# Patient Record
Sex: Female | Born: 1980 | Race: White | Hispanic: No | Marital: Single | Smoking: Current every day smoker
Health system: Southern US, Community
[De-identification: ages and names within clinical notes are randomized; demographics above are authoritative.]

## PROBLEM LIST (undated history)

## (undated) DIAGNOSIS — F319 Bipolar disorder, unspecified: Secondary | ICD-10-CM

## (undated) DIAGNOSIS — F209 Schizophrenia, unspecified: Secondary | ICD-10-CM

## (undated) HISTORY — PX: NO PAST SURGERIES: SHX2092

---
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
## 2004-07-18 ENCOUNTER — Emergency Department: Payer: Self-pay | Admitting: Emergency Medicine

## 2004-08-05 ENCOUNTER — Emergency Department: Payer: Self-pay | Admitting: Emergency Medicine

## 2005-09-20 ENCOUNTER — Emergency Department: Payer: Self-pay | Admitting: Emergency Medicine

## 2006-04-15 ENCOUNTER — Observation Stay: Payer: Self-pay | Admitting: Obstetrics and Gynecology

## 2006-04-16 ENCOUNTER — Observation Stay: Payer: Self-pay

## 2006-04-18 ENCOUNTER — Observation Stay: Payer: Self-pay

## 2006-04-21 ENCOUNTER — Inpatient Hospital Stay: Payer: Self-pay | Admitting: Obstetrics and Gynecology

## 2006-06-19 ENCOUNTER — Emergency Department: Payer: Self-pay | Admitting: Internal Medicine

## 2006-07-21 ENCOUNTER — Emergency Department: Payer: Self-pay

## 2008-01-29 ENCOUNTER — Emergency Department: Payer: Self-pay | Admitting: Internal Medicine

## 2008-03-07 ENCOUNTER — Emergency Department: Payer: Self-pay | Admitting: Emergency Medicine

## 2008-04-15 ENCOUNTER — Emergency Department: Payer: Self-pay | Admitting: Internal Medicine

## 2008-10-28 ENCOUNTER — Emergency Department: Payer: Self-pay | Admitting: Emergency Medicine

## 2009-05-23 ENCOUNTER — Encounter: Payer: Self-pay | Admitting: Obstetrics and Gynecology

## 2009-05-24 ENCOUNTER — Encounter: Payer: Self-pay | Admitting: Obstetrics and Gynecology

## 2009-06-05 ENCOUNTER — Observation Stay: Payer: Self-pay | Admitting: Obstetrics and Gynecology

## 2009-07-04 ENCOUNTER — Emergency Department: Payer: Self-pay | Admitting: Internal Medicine

## 2009-07-30 ENCOUNTER — Observation Stay: Payer: Self-pay

## 2009-08-01 ENCOUNTER — Observation Stay: Payer: Self-pay | Admitting: Obstetrics and Gynecology

## 2009-08-09 ENCOUNTER — Inpatient Hospital Stay: Payer: Self-pay | Admitting: Obstetrics and Gynecology

## 2011-12-02 ENCOUNTER — Emergency Department: Payer: Self-pay | Admitting: Emergency Medicine

## 2011-12-02 LAB — URINALYSIS, COMPLETE
Bacteria: NONE SEEN
Bilirubin,UR: NEGATIVE
Blood: NEGATIVE
Glucose,UR: NEGATIVE mg/dL (ref 0–75)
Ketone: NEGATIVE
Leukocyte Esterase: NEGATIVE
Nitrite: NEGATIVE
Ph: 6 (ref 4.5–8.0)
Protein: NEGATIVE
RBC,UR: <1 /HPF (ref 0–5)
Specific Gravity: 1.013 (ref 1.003–1.030)
Squamous Epithelial: 3
WBC UR: 1 /HPF (ref 0–5)

## 2011-12-02 LAB — PREGNANCY, URINE: Pregnancy Test, Urine: NEGATIVE m[IU]/mL

## 2012-04-22 ENCOUNTER — Ambulatory Visit: Payer: Self-pay | Admitting: Advanced Practice Midwife

## 2012-06-20 ENCOUNTER — Ambulatory Visit: Payer: Self-pay | Admitting: Family Medicine

## 2012-09-04 ENCOUNTER — Observation Stay: Payer: Self-pay

## 2012-09-04 LAB — URINALYSIS, COMPLETE
Bilirubin,UR: NEGATIVE
Blood: NEGATIVE
Glucose,UR: NEGATIVE mg/dL (ref 0–75)
Nitrite: NEGATIVE
Ph: 6 (ref 4.5–8.0)
Protein: NEGATIVE
RBC,UR: 1 /HPF (ref 0–5)
Specific Gravity: 1.015 (ref 1.003–1.030)
Squamous Epithelial: 14
WBC UR: 9 /HPF (ref 0–5)

## 2012-09-04 LAB — DRUG SCREEN, URINE

## 2012-09-05 ENCOUNTER — Ambulatory Visit: Payer: Self-pay

## 2012-09-06 LAB — BETA STREP CULTURE(ARMC)

## 2012-09-06 LAB — URINE CULTURE

## 2012-10-03 ENCOUNTER — Inpatient Hospital Stay: Payer: Self-pay | Admitting: Obstetrics and Gynecology

## 2012-10-03 LAB — CBC WITH DIFFERENTIAL/PLATELET
Basophil #: 0.1 10*3/uL (ref 0.0–0.1)
Basophil %: 0.6 %
Eosinophil #: 0.3 10*3/uL (ref 0.0–0.7)
Eosinophil %: 1.8 %
HCT: 36.4 % (ref 35.0–47.0)
HGB: 12.5 g/dL (ref 12.0–16.0)
Lymphocyte #: 2.1 10*3/uL (ref 1.0–3.6)
Lymphocyte %: 14.2 %
MCH: 32.2 pg (ref 26.0–34.0)
MCHC: 34.4 g/dL (ref 32.0–36.0)
MCV: 94 fL (ref 80–100)
Monocyte #: 1.1 x10 3/mm — ABNORMAL HIGH (ref 0.2–0.9)
Monocyte %: 7.4 %
Neutrophil #: 11.2 10*3/uL — ABNORMAL HIGH (ref 1.4–6.5)
Neutrophil %: 76 %
Platelet: 340 10*3/uL (ref 150–440)
RBC: 3.9 10*6/uL (ref 3.80–5.20)
RDW: 13 % (ref 11.5–14.5)
WBC: 14.7 10*3/uL — ABNORMAL HIGH (ref 3.6–11.0)

## 2012-10-03 LAB — DRUG SCREEN, URINE

## 2012-10-04 LAB — HEMATOCRIT: HCT: 30.8 % — ABNORMAL LOW (ref 35.0–47.0)

## 2013-12-16 ENCOUNTER — Emergency Department: Payer: Self-pay | Admitting: Emergency Medicine

## 2013-12-16 LAB — URINALYSIS, COMPLETE
Bilirubin,UR: NEGATIVE
Glucose,UR: NEGATIVE mg/dL (ref 0–75)
Ketone: NEGATIVE
Nitrite: NEGATIVE
Ph: 6 (ref 4.5–8.0)
Protein: 30
RBC,UR: 150 /HPF (ref 0–5)
Specific Gravity: 1.016 (ref 1.003–1.030)
Squamous Epithelial: 3
WBC UR: 94 /HPF (ref 0–5)

## 2014-01-14 IMAGING — US US OB US >=[ID] SNGL FETUS
1 series · 14 of 28 positions shown · non-contrast
Comparison: none

REASON FOR EXAM: dates
COMMENTS:

[Series 1: us ob us >=(id) sngl fetus · 0.26mm/px · 14 of 48 slices shown]
[im 2/48]
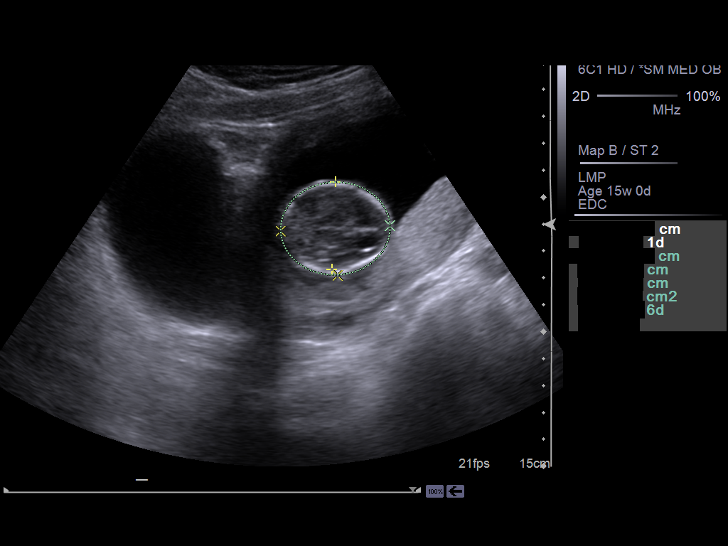
[im 6/48]
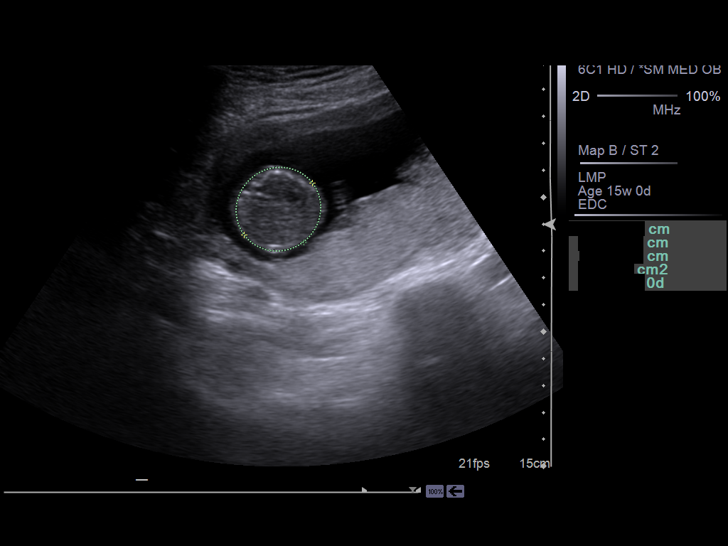
[im 9/48]
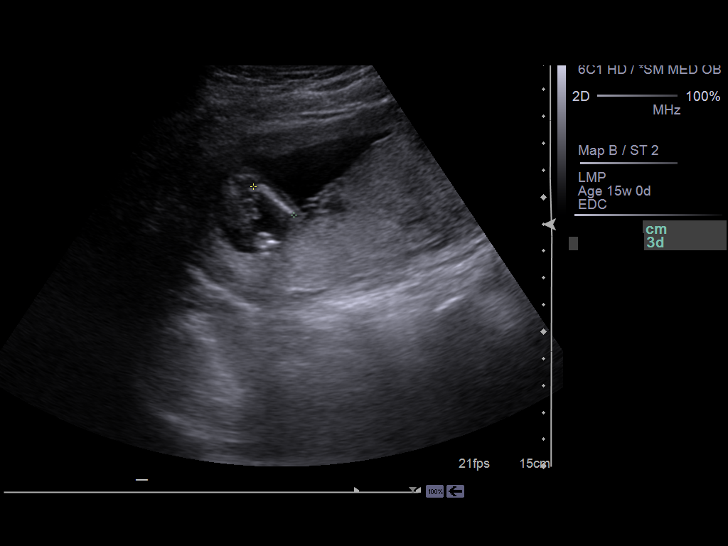
[im 13/48]
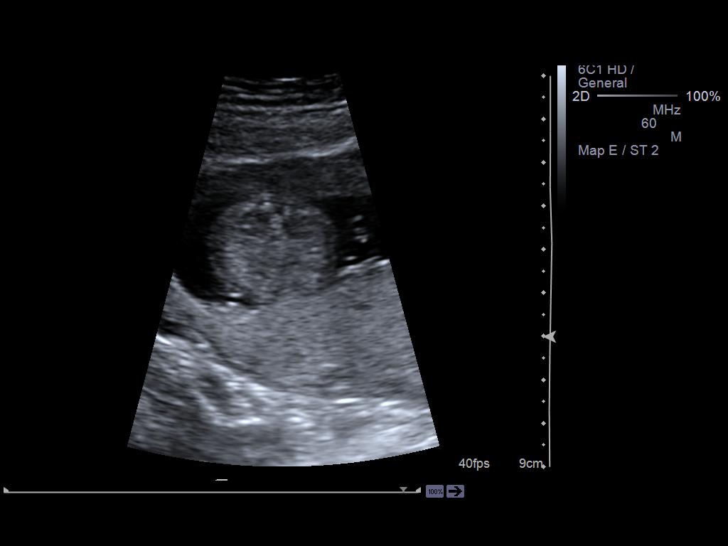
[im 16/48]
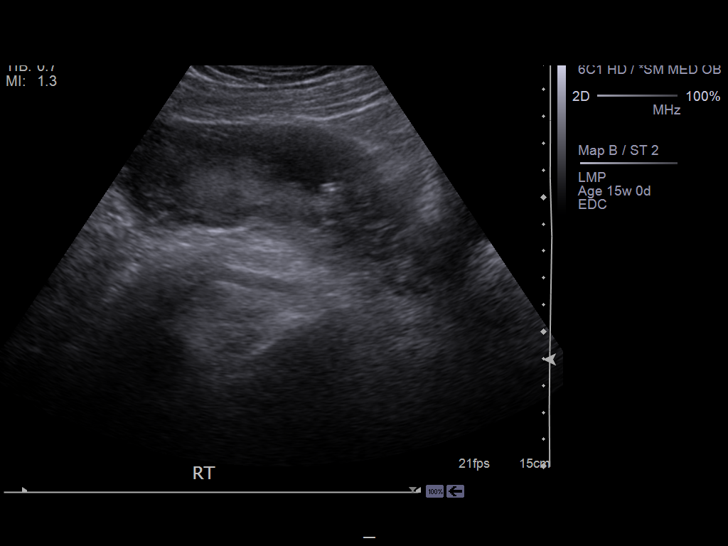
[im 20/48]
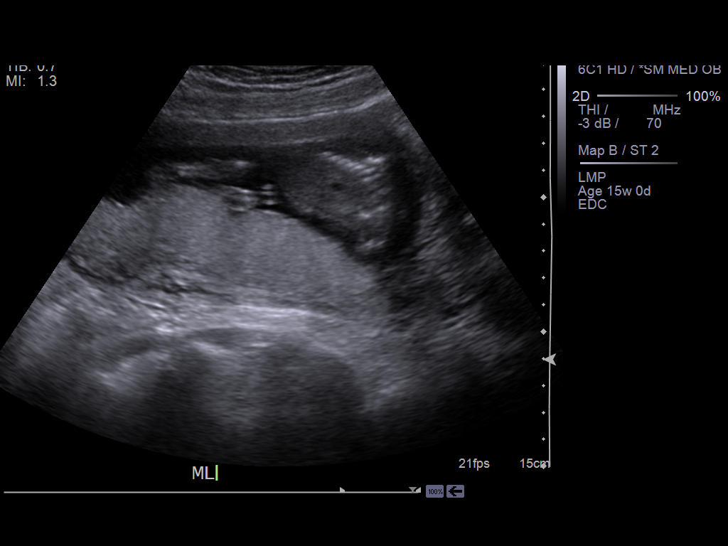
[im 23/48]
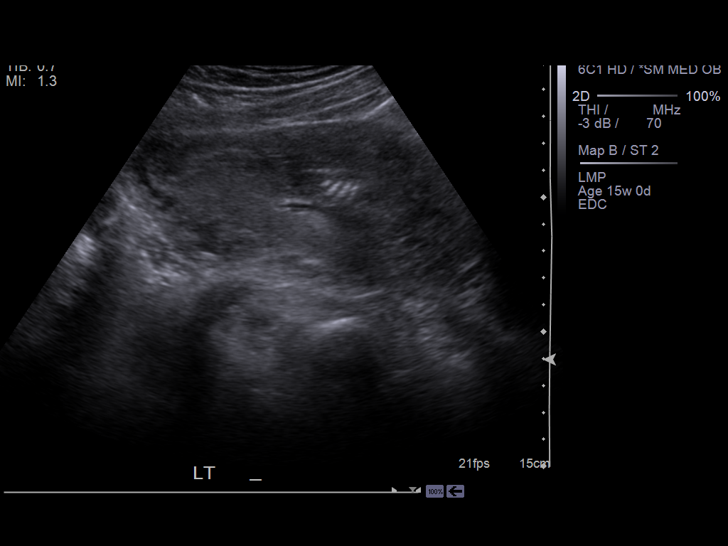
[im 27/48]
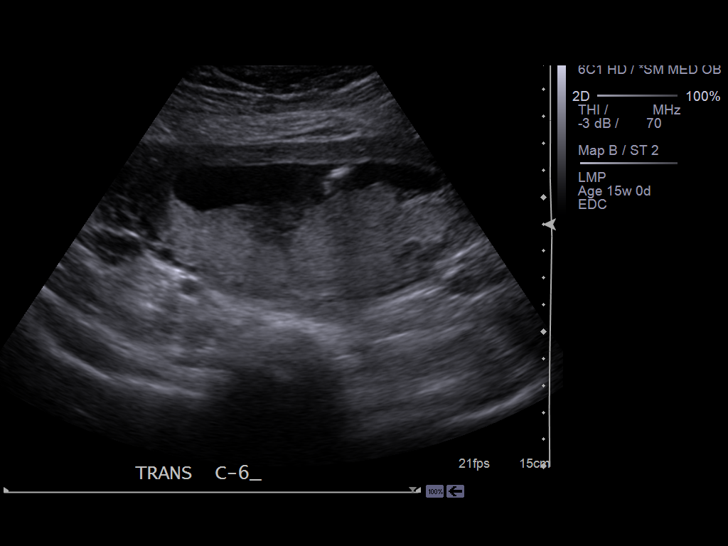
[im 30/48]
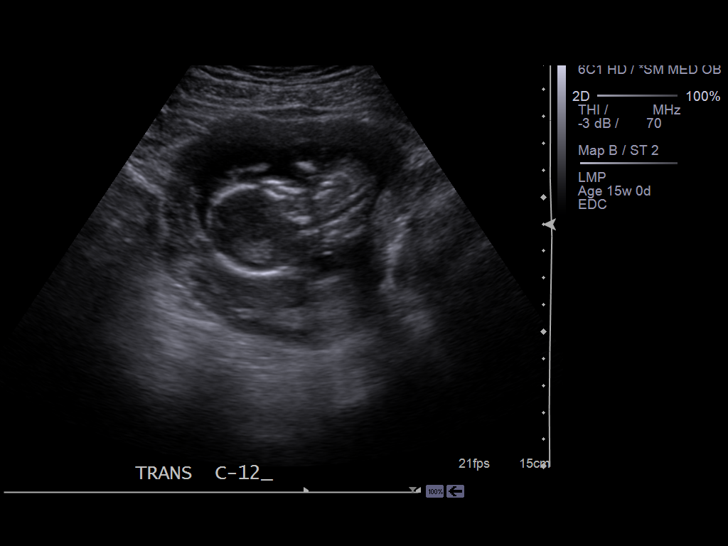
[im 34/48]
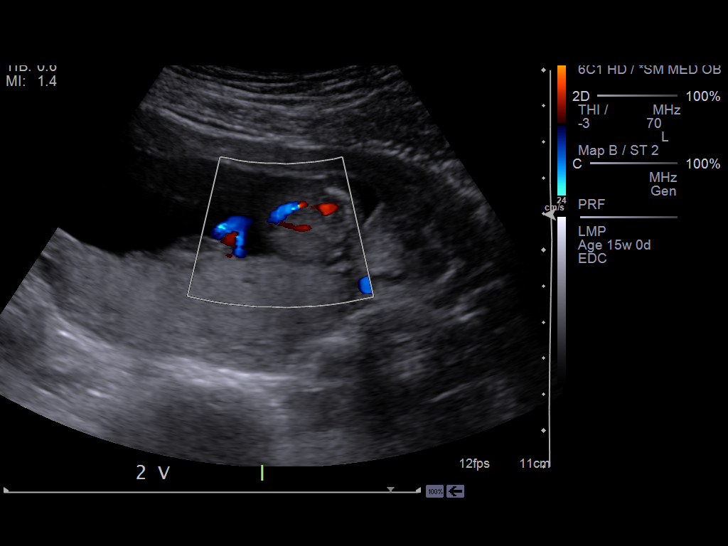
[im 37/48]
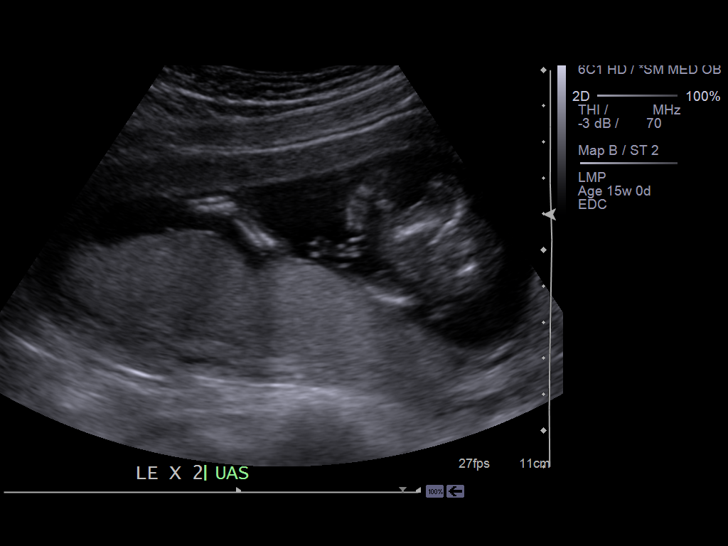
[im 41/48]
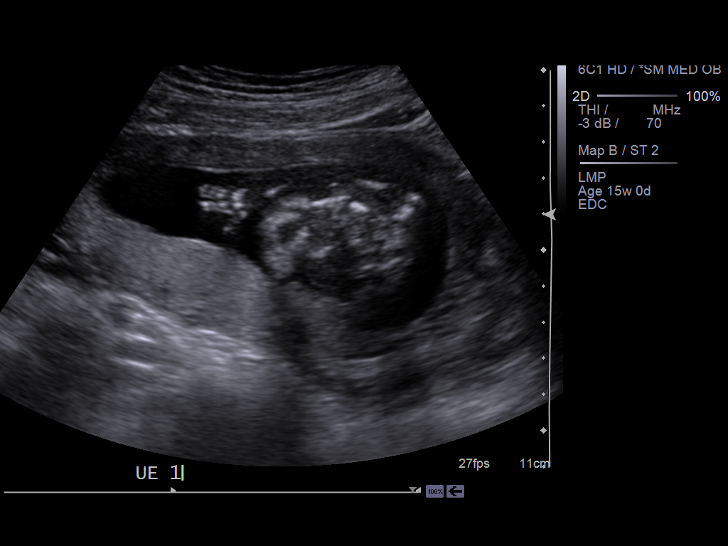
[im 44/48]
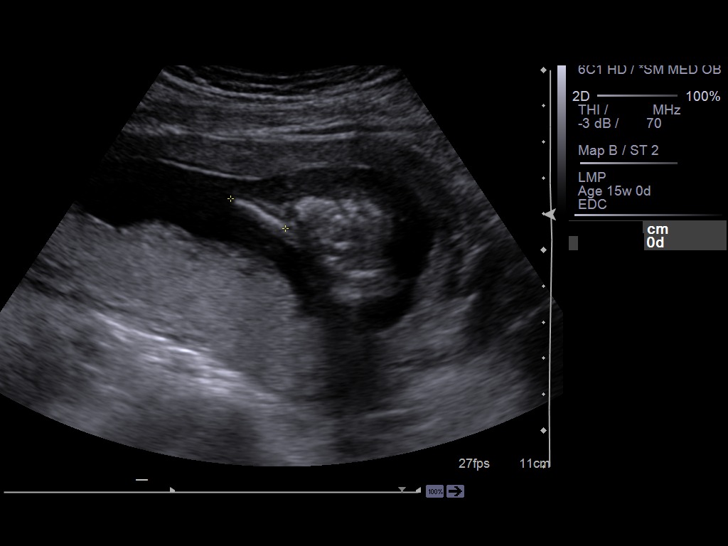
[im 48/48]
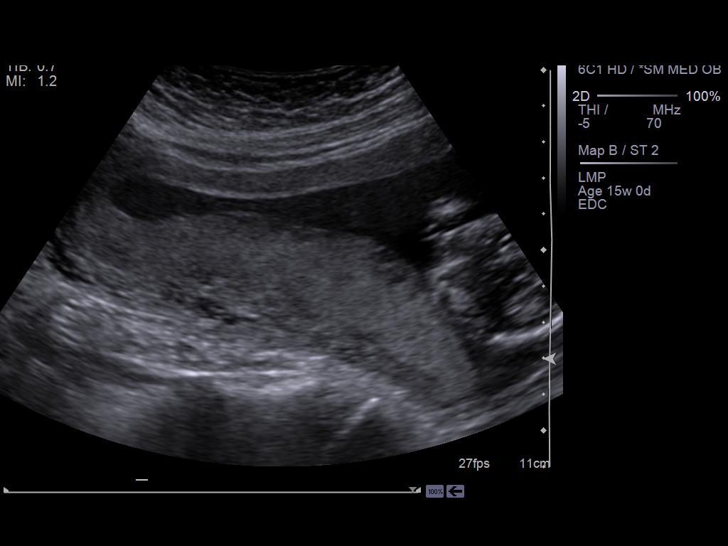

[14 of 28 positions shown; findings below may reference images not displayed]

PROCEDURE:     US  - US OB GREATER/OR EQUAL TO QQLTB  - April 22, 2012  [DATE]

RESULT:     OB ultrasound is performed. The study demonstrates a single
intrauterine fetus in a longitudinal lie with a cephalic presentation. Fetal
cardiac, trunk and extremity motion is present during the exam. The placenta
is posterior in position. Fetal cardiac activity is assessed at a rate of
144 to 147 beats per minute. The amniotic fluid volume is visually normal.
The cervix is closed with a length of 3.36 cm. The distance from the lower
placental margin to the internal cervical os is 4.09 cm. A second
measurement was 3.96 cm. Gestational age is 15 weeks 5 days with an
ultrasound EDC October 09, 2012. The fetal bladder and stomach are seen
and appear grossly normal. The fetus is too small for other anatomic
assessment.
IMPRESSION: Gestational age of 15 weeks 5 days with an ultrasound EDC October 09, 2012. No placenta previa or marginal placenta. The cervix is closed.

[REDACTED]

## 2014-06-05 DIAGNOSIS — Z62810 Personal history of physical and sexual abuse in childhood: Secondary | ICD-10-CM | POA: Insufficient documentation

## 2014-06-05 DIAGNOSIS — Z8659 Personal history of other mental and behavioral disorders: Secondary | ICD-10-CM | POA: Insufficient documentation

## 2014-06-05 DIAGNOSIS — F1921 Other psychoactive substance dependence, in remission: Secondary | ICD-10-CM | POA: Insufficient documentation
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
## 2015-11-21 LAB — HM PAP SMEAR: HM Pap smear: NEGATIVE

## 2016-05-02 ENCOUNTER — Emergency Department
Admission: EM | Admit: 2016-05-02 | Discharge: 2016-05-02 | Disposition: A | Discharge status: Home / Self Care | Payer: BLUE CROSS/BLUE SHIELD | Attending: Emergency Medicine | Admitting: Emergency Medicine

## 2016-05-02 ENCOUNTER — Encounter: Payer: Self-pay | Admitting: Emergency Medicine

## 2016-05-02 DIAGNOSIS — Y990 Civilian activity done for income or pay: Secondary | ICD-10-CM | POA: Insufficient documentation

## 2016-05-02 DIAGNOSIS — Y929 Unspecified place or not applicable: Secondary | ICD-10-CM | POA: Diagnosis not present

## 2016-05-02 DIAGNOSIS — Y9389 Activity, other specified: Secondary | ICD-10-CM | POA: Insufficient documentation

## 2016-05-02 DIAGNOSIS — S46911A Strain of unspecified muscle, fascia and tendon at shoulder and upper arm level, right arm, initial encounter: Secondary | ICD-10-CM

## 2016-05-02 DIAGNOSIS — X500XXA Overexertion from strenuous movement or load, initial encounter: Secondary | ICD-10-CM | POA: Diagnosis not present

## 2016-05-02 DIAGNOSIS — F17200 Nicotine dependence, unspecified, uncomplicated: Secondary | ICD-10-CM | POA: Diagnosis not present

## 2016-05-02 DIAGNOSIS — S4991XA Unspecified injury of right shoulder and upper arm, initial encounter: Secondary | ICD-10-CM | POA: Diagnosis present

## 2016-05-02 DIAGNOSIS — M25511 Pain in right shoulder: Secondary | ICD-10-CM

## 2016-05-02 MED ORDER — METHOCARBAMOL 750 MG PO TABS: 750.0000 mg | ORAL_TABLET | Freq: Four times a day (QID) | ORAL | 0 refills | Status: DC

## 2016-05-02 MED ORDER — NAPROXEN 500 MG PO TABS: 500.0000 mg | ORAL_TABLET | Freq: Two times a day (BID) | ORAL | 0 refills | Status: DC

## 2016-05-02 NOTE — ED Triage Notes (Addendum)
Pt to ed with c/o right shoulder pain and burning x 3 weeks.  Pt states movement makes pain worse

## 2016-05-02 NOTE — ED Provider Notes (Signed)
[facility] Emergency Department Provider Note  ____________________________________________  Time seen: Approximately 12:53 PM  I have reviewed the triage vital signs and the nursing notes.   HISTORY  Chief Complaint Shoulder Pain    HPI Cheryl Cook is a 35 y.o. female presents for evaluation of right shoulder pain. 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 Feels like she pulled a muscle in her shoulder. Denies any direct trauma to the shoulder. Denies any numbness or tingling down her arm.   History reviewed. No pertinent past medical history.  There are no active problems to display for this patient.   History reviewed. No pertinent surgical history.  Prior to Admission medications   Medication Sig Start Date End Date Taking? Authorizing Provider  methocarbamol (ROBAXIN) 750 MG tablet Take 1 tablet (750 mg total) by mouth 4 (four) times daily. 05/02/16   Evangeline Dakinharles M Lanyiah Brix, PA-C  naproxen (NAPROSYN) 500 MG tablet Take 1 tablet (500 mg total) by mouth 2 (two) times daily with a meal. 05/02/16   Evangeline Dakinharles M Gloriajean Okun, PA-C    Allergies Review of patient's allergies indicates no known allergies.  History reviewed. No pertinent family history.  Social History Social History  Substance Use Topics  . Smoking status: Current Every Day Smoker  . Smokeless tobacco: Never Used  . Alcohol use No    Review of Systems Constitutional: No fever/chills Cardiovascular: Denies chest pain. Respiratory: Denies shortness of breath. Musculoskeletal: Positive for right shoulder strain. Skin: Negative for rash. Neurological: Negative for headaches, focal weakness or numbness.  10-point ROS otherwise negative.  ____________________________________________   PHYSICAL EXAM:  VITAL SIGNS: ED Triage Vitals [05/02/16 1246]  Enc Vitals Group     BP 111/74     Pulse Rate (!) 114     Resp 20     Temp 98 F  (36.7 C)     Temp Source Oral     SpO2 99 %     Weight 145 lb (65.8 kg)     Height 5\' 2"  (1.575 m)     Head Circumference      Peak Flow      Pain Score 6     Pain Loc      Pain Edu?      Excl. in GC?     Constitutional: Alert and oriented. Well appearing and in no acute distress. Neck: No stridor.   Cardiovascular: Normal rate, regular rhythm. Grossly normal heart sounds.  Good peripheral circulation. Respiratory: Normal respiratory effort.  No retractions. Lungs CTAB. Musculoskeletal: Right shoulder full range of motion. Positive scapular tenderness. Sleeping neurovascularly intact. Neurologic:  Normal speech and language. No gross focal neurologic deficits are appreciated.  Skin:  Skin is warm, dry and intact. No rash noted. Psychiatric: Mood and affect are normal. Speech and behavior are normal.  ____________________________________________   LABS (all labs ordered are listed, but only abnormal results are displayed)  Labs Reviewed - No data to display ____________________________________________  EKG   ____________________________________________  RADIOLOGY  Deferred at this visit. ____________________________________________   PROCEDURES  Procedure(s) performed: None  Critical Care performed: No  ____________________________________________   INITIAL IMPRESSION / ASSESSMENT AND PLAN / ED COURSE  Pertinent labs & imaging results that were available during my care of the patient were reviewed by me and considered in my medical decision making (see chart for details). Review of the  [place]RS was performed in accordance of the  [facility] prior to dispensing any controlled drugs.    Clinical Course    ____________________________________________   FINAL CLINICAL IMPRESSION(S) / ED DIAGNOSES  Final diagnoses:  Shoulder pain, right  Right shoulder strain, initial encounter     This chart was dictated using voice recognition software/Dragon. Despite best  efforts to proofread, errors can occur which can change the meaning. Any change was purely unintentional.    Evangeline Dakin, PA-C 05/02/16 1302    Jeanmarie Plant, MD 05/02/16 [phone]

## 2016-05-02 NOTE — ED Notes (Signed)
See triage note  States she developed pain to right shoulder about 2-3 weeks ago  Denies any specific trauma  But states she does a lot of repetitive movement and heavy lifting at work  Pain became worse over the past 2-3 days   Has not tried any OTC meds for pain

## 2016-07-30 ENCOUNTER — Emergency Department: Payer: BLUE CROSS/BLUE SHIELD

## 2016-07-30 ENCOUNTER — Encounter: Payer: Self-pay | Admitting: Emergency Medicine

## 2016-07-30 ENCOUNTER — Emergency Department
Admission: EM | Admit: 2016-07-30 | Discharge: 2016-07-30 | Disposition: A | Discharge status: Home / Self Care | Payer: BLUE CROSS/BLUE SHIELD | Attending: Emergency Medicine | Admitting: Emergency Medicine

## 2016-07-30 DIAGNOSIS — R05 Cough: Secondary | ICD-10-CM | POA: Diagnosis present

## 2016-07-30 DIAGNOSIS — R55 Syncope and collapse: Secondary | ICD-10-CM | POA: Diagnosis not present

## 2016-07-30 DIAGNOSIS — F17210 Nicotine dependence, cigarettes, uncomplicated: Secondary | ICD-10-CM | POA: Diagnosis not present

## 2016-07-30 DIAGNOSIS — J029 Acute pharyngitis, unspecified: Secondary | ICD-10-CM | POA: Diagnosis not present

## 2016-07-30 DIAGNOSIS — R059 Cough, unspecified: Secondary | ICD-10-CM

## 2016-07-30 LAB — POCT RAPID STREP A: Streptococcus, Group A Screen (Direct): NEGATIVE

## 2016-07-30 LAB — BASIC METABOLIC PANEL
ANION GAP: 10 (ref 5–15)
CALCIUM: 9.6 mg/dL (ref 8.9–10.3)
CO2: 23 mmol/L (ref 22–32)
Chloride: 106 mmol/L (ref 101–111)
Creatinine, Ser: 0.66 mg/dL (ref 0.44–1.00)
GFR calc Af Amer: >60 mL/min (ref >60)
GLUCOSE: 102 mg/dL — AB (ref 65–99)
Potassium: 3.5 mmol/L (ref 3.5–5.1)
SODIUM: 139 mmol/L (ref 135–145)

## 2016-07-30 LAB — URINALYSIS, COMPLETE (UACMP) WITH MICROSCOPIC
BILIRUBIN URINE: NEGATIVE
GLUCOSE, UA: NEGATIVE mg/dL
Ketones, ur: NEGATIVE mg/dL
NITRITE: NEGATIVE
PH: 6 (ref 5.0–8.0)
Protein, ur: NEGATIVE mg/dL
SPECIFIC GRAVITY, URINE: 1 — AB (ref 1.005–1.030)

## 2016-07-30 LAB — POC URINE PREG, ED: Preg Test, Ur: NEGATIVE

## 2016-07-30 LAB — CBC
HCT: 41.3 % (ref 35.0–47.0)
Hemoglobin: 14.4 g/dL (ref 12.0–16.0)
MCH: 32.2 pg (ref 26.0–34.0)
MCHC: 34.8 g/dL (ref 32.0–36.0)
MCV: 92.7 fL (ref 80.0–100.0)
Platelets: 278 10*3/uL (ref 150–440)
RBC: 4.46 MIL/uL (ref 3.80–5.20)
RDW: 13.6 % (ref 11.5–14.5)
WBC: 6.9 10*3/uL (ref 3.6–11.0)

## 2016-07-30 MED ORDER — LIDOCAINE VISCOUS 2 % MT SOLN
15.0000 mL | Freq: Once | OROMUCOSAL | Status: AC
  Administered 2016-07-30: 15 mL via OROMUCOSAL

## 2016-07-30 MED ORDER — IBUPROFEN 400 MG PO TABS: 600.0000 mg | ORAL_TABLET | Freq: Once | ORAL | Status: DC

## 2016-07-30 MED ORDER — LIDOCAINE VISCOUS 2 % MT SOLN
OROMUCOSAL | Status: AC
  Administered 2016-07-30: 15 mL via OROMUCOSAL
  Filled 2016-07-30: qty 15

## 2016-07-30 MED ORDER — IBUPROFEN 600 MG PO TABS
ORAL_TABLET | ORAL | Status: AC
  Filled 2016-07-30: qty 1

## 2016-07-30 MED ORDER — IBUPROFEN 600 MG PO TABS: 600.0000 mg | ORAL_TABLET | Freq: Three times a day (TID) | ORAL | 0 refills | Status: DC | PRN

## 2016-07-30 NOTE — Discharge Instructions (Signed)
Please drink plenty of fluid, get plenty of rest, and 8 small regular meals throughout the day.  Return to the emergency department if you develop lightheadedness or fainting, fever, chest pain, palpitations, or any other symptoms concerning to you.

## 2016-07-30 NOTE — ED Triage Notes (Signed)
Pt to ED from work c/o near syncope today, denies LOC, states braced herself.  States was coughing when this happened.  Denies fever, chills, illnesses at home.

## 2016-07-30 NOTE — ED Provider Notes (Signed)
[facility] Emergency Department Provider Note  ____________________________________________  Time seen: Approximately 2:31 PM  I have reviewed the triage vital signs and the nursing notes.   HISTORY  Chief Complaint Near Syncope    HPI Cheryl Cook is a 35 y.o. female , smoker, presenting with a presyncopal episode. The patient states that she "hates water" and has not been drinking a lot. Today she was standing at work when she developed a coughing spasm, and became lightheaded and had to hold onto the counter not fall. This has since resolved, and was not associated with any chest pain, palpitations, or shortness of breath. Over the last couple of days, the patient has had a nonproductive cough with a sore throat, without ear pain, fever, or chills. No nausea vomiting or diarrhea.   History reviewed. No pertinent past medical history.  There are no active problems to display for this patient.   History reviewed. No pertinent surgical history.  Current Outpatient Rx  . Order #: [id number] Class: Print  . Order #: [id number] Class: Print  . Order #: [id number] Class: Print    Allergies Patient has no known allergies.  History reviewed. No pertinent family history.  Social History Social History  Substance Use Topics  . Smoking status: Current Every Day Smoker    Packs/day: 0.50    Types: Cigarettes  . Smokeless tobacco: Never Used  . Alcohol use No    Review of Systems Constitutional: No fever/chills.Positive lightheadedness. Negative syncope. Negative dizziness. Eyes: No visual changes. No blurred or double vision. No eye discharge. ENT: Positive sore throat. No congestion or rhinorrhea. No ear pain. Cardiovascular: Denies chest pain. Denies palpitations. Respiratory: Denies shortness of breath.  No cough. Gastrointestinal: No abdominal pain.  No nausea, no vomiting.  No diarrhea.  No constipation. Genitourinary: Negative for  dysuria. Musculoskeletal: Positive for recent back pain after strain 4 weeks ago. Skin: Negative for rash. Neurological: Negative for headaches. No focal numbness, tingling or weakness.   10-point ROS otherwise negative.  ____________________________________________   PHYSICAL EXAM:  VITAL SIGNS: ED Triage Vitals  Enc Vitals Group     BP 07/30/16 1335 (!) 132/93     Pulse Rate 07/30/16 1335 (!) 111     Resp 07/30/16 1335 18     Temp 07/30/16 1335 97.7 F (36.5 C)     Temp Source 07/30/16 1335 Oral     SpO2 07/30/16 1335 100 %     Weight 07/30/16 1336 145 lb (65.8 kg)     Height 07/30/16 1336 5\' 3"  (1.6 m)     Head Circumference --      Peak Flow --      Pain Score --      Pain Loc --      Pain Edu? --      Excl. in GC? --     Constitutional: Alert and oriented. Well appearing and in no acute distress. Answers questions appropriately. Eyes: Conjunctivae are normal.  EOMI. No scleral icterus.No eye discharge. Head: Atraumatic. Nose: No congestion/rhinnorhea. Mouth/Throat: Mucous membranes are moist. Posterior pharynx is mildly erythematous with 2 visible vesicles on the posterior palate on the right. Tonsils are without swelling or exudate. Posterior palate is symmetric and uvula is midline. Poor dentition throughout. Neck: No stridor.  Supple.  No JVD. No meningismus. Cardiovascular: Normal rate, regular rhythm. No murmurs, rubs or gallops.  Respiratory: Normal respiratory effort.  No accessory muscle use or retractions. Lungs CTAB.  No wheezes, rales or ronchi. Gastrointestinal: Soft,  nontender and nondistended.  No guarding or rebound.  No peritoneal signs. Musculoskeletal: No LE edema.  Neurologic:  A&Ox3.  Speech is clear.  Face and smile are symmetric.  EOMI.  Moves all extremities well. Normal gait without ataxia. Skin:  Skin is warm, dry and intact. No rash noted. Psychiatric: Mood and affect are normal. Speech and behavior are normal.  Normal  judgement.  ____________________________________________   LABS (all labs ordered are listed, but only abnormal results are displayed)  Labs Reviewed  BASIC METABOLIC PANEL - Abnormal; Notable for the following:       Result Value   Glucose, Bld 102 (*)    BUN <5 (*)    All other components within normal limits  URINALYSIS, COMPLETE (UACMP) WITH MICROSCOPIC - Abnormal; Notable for the following:    Color, Urine STRAW (*)    APPearance HAZY (*)    Specific Gravity, Urine 1.000 (*)    Hgb urine dipstick SMALL (*)    Leukocytes, UA TRACE (*)    Bacteria, UA RARE (*)    Squamous Epithelial / LPF 0-5 (*)    All other components within normal limits  CBC  POC URINE PREG, ED  POCT RAPID STREP A   ____________________________________________  EKG  ED ECG REPORT I, Rockne MenghiniNorman, Anne-Caroline, the attending physician, personally viewed and interpreted this ECG.   Date: 07/30/2016  EKG Time: 1343  Rate: 103  Rhythm: sinus tachycardia  Axis: normal  Intervals:none  ST&T Change: Nonspecific T-wave inversion in V1. No ST elevation. No evidence of Brugada syndrome, prolonged QTC, or hypertrophy.  ____________________________________________  RADIOLOGY  Dg Chest 2 View  Result Date: 07/30/2016 CLINICAL DATA:  Near syncope today. EXAM: CHEST  2 VIEW COMPARISON:  None. FINDINGS: The heart size and mediastinal contours are within normal limits. Both lungs are clear. The visualized skeletal structures are unremarkable. IMPRESSION: No active cardiopulmonary disease. Electronically Signed   By: Charlett NoseKevin  Dover M.D.   On: 07/30/2016 15:01    ____________________________________________   PROCEDURES  Procedure(s) performed: None  Procedures  Critical Care performed: No ____________________________________________   INITIAL IMPRESSION / ASSESSMENT AND PLAN / ED COURSE  Pertinent labs & imaging results that were available during my care of the patient were reviewed by me and considered  in my medical decision making (see chart for details).  35 y.o. female, smoker, presenting with a brief presyncopal event in the setting of standing, during a coughing spasm, after not having had much to drink today. Overall, the patient has reassuring vital signs and on my examination is no longer tachycardic without any intervention. It is possible that the patient became presyncopal due to her coughing spasm, or that she may be mildly hypovolemic given that she reports not drinking very much recently. She also has signs and symptoms consistent with a URI, and with her posterior pharyngeal erythema, I'll get a strep test. I do not think this patient has a PE. I am awaiting the results of her chest x-ray, and if these are reassuring, I'll plan to discharge her home.  ____________________________________________  FINAL CLINICAL IMPRESSION(S) / ED DIAGNOSES  Final diagnoses:  Cough  Sore throat  Near syncope    Clinical Course       NEW MEDICATIONS STARTED DURING THIS VISIT:  Discharge Medication List as of 07/30/2016  3:11 PM    START taking these medications   Details  ibuprofen (ADVIL,MOTRIN) 600 MG tablet Take 1 tablet (600 mg total) by mouth every 8 (eight) hours as  needed for mild pain or moderate pain (with food)., Starting Thu 07/30/2016, Print          Rockne Menghini, MD 07/30/16 2118

## 2017-01-12 LAB — HM HIV SCREENING LAB: HM HIV Screening: NEGATIVE

## 2019-02-04 ENCOUNTER — Encounter: Payer: Self-pay | Admitting: Emergency Medicine

## 2019-02-04 ENCOUNTER — Emergency Department
Admission: EM | Admit: 2019-02-04 | Discharge: 2019-02-06 | Disposition: A | Discharge status: Other Acute Inpatient Hospital | Payer: Medicaid Other | Attending: Emergency Medicine | Admitting: Emergency Medicine

## 2019-02-04 ENCOUNTER — Other Ambulatory Visit: Payer: Self-pay

## 2019-02-04 ENCOUNTER — Emergency Department: Payer: Medicaid Other

## 2019-02-04 DIAGNOSIS — Z046 Encounter for general psychiatric examination, requested by authority: Secondary | ICD-10-CM | POA: Diagnosis not present

## 2019-02-04 DIAGNOSIS — Z79899 Other long term (current) drug therapy: Secondary | ICD-10-CM | POA: Diagnosis not present

## 2019-02-04 DIAGNOSIS — F17210 Nicotine dependence, cigarettes, uncomplicated: Secondary | ICD-10-CM | POA: Diagnosis not present

## 2019-02-04 DIAGNOSIS — F32A Depression, unspecified: Secondary | ICD-10-CM

## 2019-02-04 DIAGNOSIS — Z03818 Encounter for observation for suspected exposure to other biological agents ruled out: Secondary | ICD-10-CM | POA: Insufficient documentation

## 2019-02-04 DIAGNOSIS — F23 Brief psychotic disorder: Secondary | ICD-10-CM | POA: Diagnosis present

## 2019-02-04 DIAGNOSIS — R45851 Suicidal ideations: Secondary | ICD-10-CM | POA: Insufficient documentation

## 2019-02-04 DIAGNOSIS — R4182 Altered mental status, unspecified: Secondary | ICD-10-CM | POA: Insufficient documentation

## 2019-02-04 DIAGNOSIS — F1210 Cannabis abuse, uncomplicated: Secondary | ICD-10-CM

## 2019-02-04 DIAGNOSIS — F329 Major depressive disorder, single episode, unspecified: Secondary | ICD-10-CM | POA: Diagnosis not present

## 2019-02-04 DIAGNOSIS — R410 Disorientation, unspecified: Secondary | ICD-10-CM | POA: Diagnosis present

## 2019-02-04 LAB — CBC
HCT: 44.7 % (ref 36.0–46.0)
Hemoglobin: 15.1 g/dL — ABNORMAL HIGH (ref 12.0–15.0)
MCH: 31 pg (ref 26.0–34.0)
MCHC: 33.8 g/dL (ref 30.0–36.0)
MCV: 91.8 fL (ref 80.0–100.0)
Platelets: 315 10*3/uL (ref 150–400)
RBC: 4.87 MIL/uL (ref 3.87–5.11)
RDW: 12.5 % (ref 11.5–15.5)
WBC: 11.1 10*3/uL — ABNORMAL HIGH (ref 4.0–10.5)
nRBC: 0 % (ref 0.0–0.2)

## 2019-02-04 LAB — COMPREHENSIVE METABOLIC PANEL
ALT: 12 U/L (ref 0–44)
AST: 17 U/L (ref 15–41)
Albumin: 4.4 g/dL (ref 3.5–5.0)
Alkaline Phosphatase: 67 U/L (ref 38–126)
Anion gap: 13 (ref 5–15)
BUN: 9 mg/dL (ref 6–20)
CO2: 26 mmol/L (ref 22–32)
Calcium: 9.5 mg/dL (ref 8.9–10.3)
Chloride: 101 mmol/L (ref 98–111)
Creatinine, Ser: 0.85 mg/dL (ref 0.44–1.00)
GFR calc Af Amer: >60 mL/min (ref >60)
GFR calc non Af Amer: >60 mL/min (ref >60)
Glucose, Bld: 105 mg/dL — ABNORMAL HIGH (ref 70–99)
Potassium: 2.7 mmol/L — CL (ref 3.5–5.1)
Sodium: 140 mmol/L (ref 135–145)
Total Bilirubin: 0.4 mg/dL (ref 0.3–1.2)
Total Protein: 8 g/dL (ref 6.5–8.1)

## 2019-02-04 LAB — URINE DRUG SCREEN, QUALITATIVE (ARMC ONLY)
Amphetamines, Ur Screen: NOT DETECTED
Barbiturates, Ur Screen: NOT DETECTED
Benzodiazepine, Ur Scrn: NOT DETECTED
Cannabinoid 50 Ng, Ur ~~LOC~~: POSITIVE — AB
Cocaine Metabolite,Ur ~~LOC~~: NOT DETECTED
MDMA (Ecstasy)Ur Screen: NOT DETECTED
Methadone Scn, Ur: NOT DETECTED
Opiate, Ur Screen: NOT DETECTED
Phencyclidine (PCP) Ur S: NOT DETECTED
Tricyclic, Ur Screen: NOT DETECTED

## 2019-02-04 LAB — POCT PREGNANCY, URINE: Preg Test, Ur: NEGATIVE

## 2019-02-04 LAB — SALICYLATE LEVEL: Salicylate Lvl: <7 mg/dL (ref 2.8–30.0)

## 2019-02-04 LAB — ACETAMINOPHEN LEVEL: Acetaminophen (Tylenol), Serum: <10 ug/mL — ABNORMAL LOW (ref 10–30)

## 2019-02-04 LAB — ETHANOL: Alcohol, Ethyl (B): <10 mg/dL (ref <10)

## 2019-02-04 MED ORDER — POTASSIUM CHLORIDE CRYS ER 20 MEQ PO TBCR
20.0000 meq | EXTENDED_RELEASE_TABLET | Freq: Two times a day (BID) | ORAL | Status: DC
  Administered 2019-02-05 – 2019-02-06 (×3): 20 meq via ORAL
  Filled 2019-02-04 (×4): qty 1
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
## 2019-02-04 NOTE — ED Triage Notes (Signed)
Pt arrived via POV with reports of being around people with COVID-19 and states she is having sxs of shortness of breath and cough, pt states her sxs started a while back.   Pt states she stares off in a daze sometimes when she talks to people. Pt states she fell recently but is not sure when she fell, but states she hit the back of her head.

## 2019-02-04 NOTE — ED Notes (Signed)
Pt. Moved from quad #20 to [place] #1.  Pt. Advised of cameras in unit and 15 min. Checks.  Pt. Appears to be confused as to why she is here at hospital.  Pt. States "My sister brought me here"  Pt. Asked "Why did she bring you here?"  Pt. States "I don't know, sometimes we do crazy things".

## 2019-02-04 NOTE — ED Notes (Addendum)
When asked pt what sxs of COVID she is having she states "I cough a lot, sometimes". Pt thinks she has been exposed for a while.  Per first nurse, sister reported that the patient has been paranoid and was planning to go to [place], but reports pt is getting worse.  Pt does admit to having auditory and visual hallucinations. Pt states she is not sure what kind of voices she is hearing.

## 2019-02-04 NOTE — ED Provider Notes (Signed)
[facility] Emergency Department Provider Note  Time seen: 6:47 PM  I have reviewed the triage vital signs and the nursing notes.   HISTORY  Chief Complaint Psychiatric Evaluation   HPI Cheryl Cook is a 38 y.o. female with no known past medical history presents to the emergency department with confusion and suicidal ideation.  According to the patient she has been feeling confused at times, she gives examples of forgetting her children's birthdays.  Patient cannot tell me how long it has been going on for.  States she did hit her head, but cannot tell me when this happened if it was today or 10 years ago.  Patient denies any drug or alcohol use.  Is able to tell me that her last menstrual period was 2 weeks ago.  At times the patient refuses to answer questions and will just stare but when you asked several times she will answer the question.  Patient states she has had thoughts of hurting herself but cannot tell me over what timeframe.  Cannot tell me any active plan to do so.   History reviewed. No pertinent past medical history.  There are no active problems to display for this patient.   History reviewed. No pertinent surgical history.  Prior to Admission medications   Medication Sig Start Date End Date Taking? Authorizing Provider  ibuprofen (ADVIL,MOTRIN) 600 MG tablet Take 1 tablet (600 mg total) by mouth every 8 (eight) hours as needed for mild pain or moderate pain (with food). 07/30/16   Eula Listen, MD  methocarbamol (ROBAXIN) 750 MG tablet Take 1 tablet (750 mg total) by mouth 4 (four) times daily. 05/02/16   Beers, Pierce Crane, PA-C  naproxen (NAPROSYN) 500 MG tablet Take 1 tablet (500 mg total) by mouth 2 (two) times daily with a meal. 05/02/16   Beers, Pierce Crane, PA-C    No Known Allergies  No family history on file.  Social History Social History   Tobacco Use  . Smoking status: Current Every Day Smoker    Packs/day: 0.50    Types:  Cigarettes  . Smokeless tobacco: Never Used  Substance Use Topics  . Alcohol use: No  . Drug use: No    Review of Systems Constitutional: Negative for fever. Cardiovascular: Negative for chest pain. Respiratory: Negative for shortness of breath. Gastrointestinal: Negative for abdominal pain, vomiting Musculoskeletal: Negative for musculoskeletal complaints Skin: Negative for skin complaints  Neurological: Negative for headache All other ROS negative  ____________________________________________   PHYSICAL EXAM:  VITAL SIGNS: ED Triage Vitals  Enc Vitals Group     BP 02/04/19 1818 138/87     Pulse Rate 02/04/19 1818 (!) 110     Resp 02/04/19 1818 18     Temp 02/04/19 1818 98.4 F (36.9 C)     Temp Source 02/04/19 1818 Oral     SpO2 02/04/19 1818 97 %     Weight 02/04/19 1823 170 lb (77.1 kg)     Height 02/04/19 1823 5' 3.5" (1.613 m)     Head Circumference --      Peak Flow --      Pain Score 02/04/19 1823 0     Pain Loc --      Pain Edu? --      Excl. in [place]? --    Constitutional: Patient is awake and alert.  Refuses answer questions at first on occasion but will answer when asked multiple times. Eyes: Normal exam ENT  Head: Normocephalic and atraumatic.      Mouth/Throat: Mucous membranes are moist. Cardiovascular: Normal rate, regular rhythm.  Respiratory: Normal respiratory effort without tachypnea nor retractions. Breath sounds are clear Gastrointestinal: Soft and nontender. No distention.   Musculoskeletal: Nontender with normal range of motion in all extremities.  Neurologic:  Normal speech and language. No gross focal neurologic deficits Skin:  Skin is warm, dry and intact.  Psychiatric: Mood and affect are normal.   ____________________________________________   RADIOLOGY  Patient refused CT head.  ____________________________________________   INITIAL IMPRESSION / ASSESSMENT AND PLAN / ED COURSE  Pertinent labs & imaging results that were  available during my care of the patient were reviewed by me and considered in my medical decision making (see chart for details).   She presents emergency department for confusion as well as suicidal thoughts.  Patient is extremely poor historian, cannot give me a timeframe for nearly all questions asked.  Given the report of hitting her head it is unclear when this happened we will obtain a CT scan of the head as a precaution.  We will check labs.  Given the patient's suicidal thoughts we will place under IVC and have psychiatry evaluate.  Patient refused CT scan of the head.  Patient's lab work is largely nonrevealing besides positive cannabinoids and a low potassium.  We will start the patient on potassium repletion.  Awaiting psychiatric evaluation and disposition.  Cheryl LedererMary J Cook was evaluated in Emergency Department on 02/04/2019 for the symptoms described in the history of present illness. She was evaluated in the context of the global COVID-19 pandemic, which necessitated consideration that the patient might be at risk for infection with the SARS-CoV-2 virus that causes COVID-19. Institutional protocols and algorithms that pertain to the evaluation of patients at risk for COVID-19 are in a state of rapid change based on information released by regulatory bodies including the CDC and federal and state organizations. These policies and algorithms were followed during the patient's care in the ED.  ____________________________________________   FINAL CLINICAL IMPRESSION(S) / ED DIAGNOSES  Suicidal thoughts Confusion   Minna AntisPaduchowski, Ceasia Elwell, MD 02/04/19 2135

## 2019-02-04 NOTE — ED Notes (Signed)
Belongings: 1 pair flip flops 1 pair leggings 1 pair underwear 1 gray t-shirt

## 2019-02-04 NOTE — ED Notes (Signed)
Pt walked over to [place] by this rn and Product manager.

## 2019-02-04 NOTE — ED Notes (Signed)
Spoke with the patient at great length about complying with doctor orders.  Pt. At times will appear that she understands only to still say "No" to scans or medication.

## 2019-02-05 ENCOUNTER — Emergency Department: Payer: Medicaid Other

## 2019-02-05 LAB — TSH: TSH: 0.686 u[IU]/mL (ref 0.350–4.500)

## 2019-02-05 MED ORDER — POTASSIUM CHLORIDE CRYS ER 20 MEQ PO TBCR
40.0000 meq | EXTENDED_RELEASE_TABLET | Freq: Once | ORAL | Status: AC
  Administered 2019-02-05: 40 meq via ORAL
  Filled 2019-02-05: qty 2

## 2019-02-05 MED ORDER — OLANZAPINE 10 MG IM SOLR
5.0000 mg | Freq: Once | INTRAMUSCULAR | Status: AC
  Administered 2019-02-05: 5 mg via INTRAMUSCULAR
  Filled 2019-02-05: qty 10

## 2019-02-05 MED ORDER — OLANZAPINE 10 MG PO TABS: 10.0000 mg | ORAL_TABLET | Freq: Two times a day (BID) | ORAL | Status: DC | PRN

## 2019-02-05 MED ORDER — OLANZAPINE 10 MG PO TBDP
10.0000 mg | ORAL_TABLET | Freq: Every day | ORAL | Status: DC
  Administered 2019-02-05: 10 mg via ORAL
  Filled 2019-02-05 (×2): qty 1

## 2019-02-05 MED ORDER — MAGNESIUM OXIDE 400 (241.3 MG) MG PO TABS
400.0000 mg | ORAL_TABLET | Freq: Every day | ORAL | Status: DC
  Administered 2019-02-05 – 2019-02-06 (×2): 400 mg via ORAL
  Filled 2019-02-05 (×2): qty 1

## 2019-02-05 NOTE — ED Provider Notes (Signed)
-----------------------------------------   4:13 AM on 02/05/2019 -----------------------------------------  Patient was evaluated by [facility] psychiatrist Dr. Luz Lex who recommends inpatient psychiatric admission.  We will add TSH and reattempt CT head.  Zyprexa ordered as recommended.   Paulette Blanch, MD 02/05/19 [phone]

## 2019-02-05 NOTE — ED Notes (Signed)
Hourly rounding reveals patient sleeping in room. No complaints, stable, in no acute distress. Q15 minute rounds and monitoring via Security Cameras to continue. 
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
## 2019-02-05 NOTE — BH Assessment (Signed)
TTS available for teleassessment at [phone].   

## 2019-02-05 NOTE — ED Notes (Signed)
Pt. Finished SOC, pt. Walking around in dayroom.  Pt. Asked, Do you have any questions after talking to [facility].  Pt. States, "I want to make a video for my daughter".  Pt. Told she would be able to call daughter in the morning.  Pt. Continues to pace in [place].

## 2019-02-05 NOTE — BH Assessment (Signed)
TTS available for teleassessment at [phone].  Nurse reported patient was sleeping.

## 2019-02-05 NOTE — ED Notes (Signed)
Pt. Attempted to move past this nurse while rounding on patients to come into nursing station, pt reminded again that she was in a locked facility and she was involuntary committed until a doctor releases you.  Pt. Reminded it was late and she would be staying for the night.  Pt. Directed back to room, pt. Returned to room.  Pt. Stood in room for about 10 minutes and then got into bed.

## 2019-02-05 NOTE — ED Notes (Signed)
Pt given sandwich tray and shasta lime with ice. No other needs voiced at this time. Will continue to monitor Q15 minute rounds. 

## 2019-02-05 NOTE — BH Assessment (Signed)
[place] Referral Information was faxed to the following:   [place]

## 2019-02-05 NOTE — ED Notes (Signed)
Hourly rounding reveals patient in room. No complaints, stable, in no acute distress. Q15 minute rounds and monitoring via Security Cameras to continue. 

## 2019-02-05 NOTE — ED Notes (Signed)
SOC called report given [facility] machine set up and ready, pt. Ready.

## 2019-02-05 NOTE — ED Notes (Signed)
Report to include Situation, Background, Assessment, and Recommendations received from Rhea RN. Patient alert and oriented, warm and dry, in no acute distress. Patient denies SI, HI, AVH and pain. Patient made aware of Q15 minute rounds and security cameras for their safety. Patient instructed to come to me with needs or concerns. 

## 2019-02-06 ENCOUNTER — Encounter: Payer: Self-pay | Admitting: Psychiatry

## 2019-02-06 ENCOUNTER — Inpatient Hospital Stay
Admission: AD | Admit: 2019-02-06 | Discharge: 2019-02-09 | DRG: 885 | Disposition: A | Discharge status: Home / Self Care | Payer: Medicaid Other | Attending: Psychiatry | Admitting: Psychiatry

## 2019-02-06 ENCOUNTER — Other Ambulatory Visit: Payer: Self-pay

## 2019-02-06 DIAGNOSIS — G4700 Insomnia, unspecified: Secondary | ICD-10-CM | POA: Diagnosis present

## 2019-02-06 DIAGNOSIS — F1210 Cannabis abuse, uncomplicated: Secondary | ICD-10-CM | POA: Diagnosis present

## 2019-02-06 DIAGNOSIS — R45851 Suicidal ideations: Secondary | ICD-10-CM | POA: Diagnosis present

## 2019-02-06 DIAGNOSIS — F23 Brief psychotic disorder: Secondary | ICD-10-CM

## 2019-02-06 DIAGNOSIS — F419 Anxiety disorder, unspecified: Secondary | ICD-10-CM | POA: Diagnosis present

## 2019-02-06 DIAGNOSIS — F17210 Nicotine dependence, cigarettes, uncomplicated: Secondary | ICD-10-CM | POA: Diagnosis present

## 2019-02-06 DIAGNOSIS — F333 Major depressive disorder, recurrent, severe with psychotic symptoms: Principal | ICD-10-CM | POA: Diagnosis present

## 2019-02-06 DIAGNOSIS — F329 Major depressive disorder, single episode, unspecified: Secondary | ICD-10-CM | POA: Diagnosis not present

## 2019-02-06 LAB — BASIC METABOLIC PANEL
Anion gap: 8 (ref 5–15)
BUN: 9 mg/dL (ref 6–20)
CO2: 24 mmol/L (ref 22–32)
Calcium: 8.8 mg/dL — ABNORMAL LOW (ref 8.9–10.3)
Chloride: 109 mmol/L (ref 98–111)
Creatinine, Ser: 0.72 mg/dL (ref 0.44–1.00)
GFR calc Af Amer: >60 mL/min (ref >60)
GFR calc non Af Amer: >60 mL/min (ref >60)
Glucose, Bld: 97 mg/dL (ref 70–99)
Potassium: 4.4 mmol/L (ref 3.5–5.1)
Sodium: 141 mmol/L (ref 135–145)

## 2019-02-06 LAB — LIPID PANEL
Cholesterol: 134 mg/dL (ref 0–200)
HDL: 39 mg/dL — ABNORMAL LOW (ref >40)
LDL Cholesterol: 80 mg/dL (ref 0–99)
Total CHOL/HDL Ratio: 3.4 RATIO
Triglycerides: 77 mg/dL (ref <150)
VLDL: 15 mg/dL (ref 0–40)

## 2019-02-06 LAB — SARS CORONAVIRUS 2 BY RT PCR (HOSPITAL ORDER, PERFORMED IN ~~LOC~~ HOSPITAL LAB): SARS Coronavirus 2: NEGATIVE

## 2019-02-06 MED ORDER — MAGNESIUM HYDROXIDE 400 MG/5ML PO SUSP: 30.0000 mL | Freq: Every day | ORAL | Status: DC | PRN

## 2019-02-06 MED ORDER — OLANZAPINE 5 MG PO TBDP: 10.0000 mg | ORAL_TABLET | Freq: Three times a day (TID) | ORAL | Status: DC | PRN

## 2019-02-06 MED ORDER — ADULT MULTIVITAMIN W/MINERALS CH
1.0000 | ORAL_TABLET | Freq: Every day | ORAL | Status: DC
  Administered 2019-02-06 – 2019-02-09 (×4): 1 via ORAL
  Filled 2019-02-06 (×4): qty 1

## 2019-02-06 MED ORDER — LORAZEPAM 1 MG PO TABS: 1.0000 mg | ORAL_TABLET | ORAL | Status: DC | PRN

## 2019-02-06 MED ORDER — OLANZAPINE 5 MG PO TBDP
10.0000 mg | ORAL_TABLET | Freq: Every day | ORAL | Status: DC
  Administered 2019-02-06: 10 mg via ORAL
  Filled 2019-02-06: qty 2

## 2019-02-06 MED ORDER — MAGNESIUM OXIDE 400 (241.3 MG) MG PO TABS
400.0000 mg | ORAL_TABLET | Freq: Every day | ORAL | Status: DC
  Administered 2019-02-07 – 2019-02-09 (×3): 400 mg via ORAL
  Filled 2019-02-06 (×3): qty 1

## 2019-02-06 MED ORDER — ACETAMINOPHEN 325 MG PO TABS: 650.0000 mg | ORAL_TABLET | Freq: Four times a day (QID) | ORAL | Status: DC | PRN

## 2019-02-06 MED ORDER — OLANZAPINE 10 MG PO TBDP
10.0000 mg | ORAL_TABLET | Freq: Once | ORAL | Status: AC
  Administered 2019-02-06: 10 mg via ORAL
  Filled 2019-02-06: qty 2
  Filled 2019-02-06: qty 1

## 2019-02-06 MED ORDER — ZIPRASIDONE MESYLATE 20 MG IM SOLR: 20.0000 mg | INTRAMUSCULAR | Status: DC | PRN

## 2019-02-06 MED ORDER — POTASSIUM CHLORIDE CRYS ER 20 MEQ PO TBCR
20.0000 meq | EXTENDED_RELEASE_TABLET | Freq: Two times a day (BID) | ORAL | Status: DC
  Administered 2019-02-06 – 2019-02-09 (×6): 20 meq via ORAL
  Filled 2019-02-06 (×6): qty 1

## 2019-02-06 MED ORDER — ALUM & MAG HYDROXIDE-SIMETH 200-200-20 MG/5ML PO SUSP: 30.0000 mL | ORAL | Status: DC | PRN

## 2019-02-06 NOTE — BH Assessment (Signed)
Patient is to be admitted to [facility] by Dr. Leverne Humbles.  Attending Physician will be Dr. Weber Cooks.   Patient has been assigned to room 303, by [facility] Charge Nurse Dementria.   ER staff is aware of the admission:  Glenda, ER Secretary    Dr. Cinda Quest, ER MD   Donneta Romberg, Patient's Nurse   Gust Rung., Patient Access.

## 2019-02-06 NOTE — ED Notes (Signed)
Hourly rounding reveals patient sleeping in room. No complaints, stable, in no acute distress. Q15 minute rounds and monitoring via Security Cameras to continue. 

## 2019-02-06 NOTE — Plan of Care (Signed)
New admit , still monitoring for progress.
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
## 2019-02-06 NOTE — Progress Notes (Signed)
Patient is a new admit to the unit , 38 y/o. Female  admitted under involuntary commitment for depression and anxiety and patient expressed temporary confusion that is attributed to the COVID-19 out break and stated that she  needed   Some one to talk to for therapy, patient denies any drugs or alcohol  Use, also patient denies any suicidal, homicidal ideations and there is no signs of delusions or hallucinations at this time , upon further assessment patient endorses anger problems over little things, patient live with her mother and 5 children the youngest is 30 y/o. Body search and skin check id conducted by two nurses Alex/Veronique RN. There is no contraband found on patient and skin has multiple tattoos but broken skin noted. Unit guide lines and expected behaviors are explained to patient , room unit orientation is complete , snack and beverages are offered and accepted, patient is made aware of security cameras in the hall ways and 15 minutes safety checks noted admission assessment is complete.

## 2019-02-06 NOTE — Tx Team (Signed)
Initial Treatment Plan 02/06/2019 8:49 PM Cheryl Cook WLN:[id number]    PATIENT STRESSORS: Financial difficulties Health problems Occupational concerns   PATIENT STRENGTHS: Average or above average intelligence Motivation for treatment/growth Supportive family/friends   PATIENT IDENTIFIED PROBLEMS: Depression/Anxiety    Confused                 DISCHARGE CRITERIA:  Adequate post-discharge living arrangements Motivation to continue treatment in a less acute level of care Reduction of life-threatening or endangering symptoms to within safe limits  PRELIMINARY DISCHARGE PLAN: Attend 12-step recovery group Participate in family therapy Return to previous living arrangement  PATIENT/FAMILY INVOLVEMENT: This treatment plan has been presented to and reviewed with the patient, Cheryl Cook, .  The patient  have been given the opportunity to ask questions and make suggestions.  Clemens Catholic, RN 02/06/2019, 8:49 PM

## 2019-02-06 NOTE — ED Provider Notes (Signed)
-----------------------------------------   6:15 AM on 02/06/2019 -----------------------------------------   Blood pressure 114/77, pulse 92, temperature 98.2 F (36.8 C), temperature source Oral, resp. rate 18, height 5' 3.5" (1.613 m), weight 77.1 kg, last menstrual period 02/02/2019, SpO2 100 %.  The patient is sleeping at this time.  There have been no acute events since the last update.  Awaiting disposition plan from Behavioral Medicine team.   Paulette Blanch, MD 02/06/19 [phone]

## 2019-02-06 NOTE — ED Notes (Signed)
Patient talking with psychiatrist and TTS  

## 2019-02-06 NOTE — ED Notes (Signed)
Report to include Situation, Background, Assessment, and Recommendations received from Jadeka RN. Patient alert and oriented, warm and dry, in no acute distress. Patient denies SI, HI, AVH and pain. Patient made aware of Q15 minute rounds and security cameras for their safety. Patient instructed to come to me with needs or concerns. 

## 2019-02-06 NOTE — ED Notes (Signed)
Patient came up to the nurses station and appeared confused, she asked what todays day was and she said July 12th, staff informed her it was June 15, then she asked do I need to take a shower, Probation officer informed her she just took a shower, she then asked has lunch come, Probation officer informed her she just threw her lunch tray away, she said oh ok. Staff discussed with her what the next step may be, just waiting for notes from doctor and TTS and once I know the plan I will let her know, she said ok.

## 2019-02-07 DIAGNOSIS — F333 Major depressive disorder, recurrent, severe with psychotic symptoms: Principal | ICD-10-CM

## 2019-02-07 MED ORDER — OLANZAPINE 10 MG PO TABS
10.0000 mg | ORAL_TABLET | Freq: Every day | ORAL | Status: DC
  Administered 2019-02-07 – 2019-02-08 (×2): 10 mg via ORAL
  Filled 2019-02-07 (×2): qty 1

## 2019-02-07 MED ORDER — CITALOPRAM HYDROBROMIDE 20 MG PO TABS
20.0000 mg | ORAL_TABLET | Freq: Every day | ORAL | Status: DC
  Administered 2019-02-07 – 2019-02-09 (×3): 20 mg via ORAL
  Filled 2019-02-07 (×3): qty 1

## 2019-02-07 MED ORDER — NICOTINE 21 MG/24HR TD PT24
21.0000 mg | MEDICATED_PATCH | Freq: Every day | TRANSDERMAL | Status: DC
  Administered 2019-02-07 – 2019-02-09 (×3): 21 mg via TRANSDERMAL
  Filled 2019-02-07 (×3): qty 1

## 2019-02-07 NOTE — [facility] Suicide Risk Assessment (Signed)
[facility]   Nursing information obtained from:  Patient Demographic factors:  NA Current Mental Status:  NA Loss Factors:  NA Historical Factors:  NA Risk Reduction Factors:  Positive therapeutic relationship  Total Time spent with patient: 1 hour Principal Problem: Severe recurrent major depression with psychotic features ([name]) Diagnosis:  Principal Problem:   Severe recurrent major depression with psychotic features ([name]) Active Problems:   Acute psychosis ([name])   Marijuana abuse  Subjective Data: Patient seen and chart reviewed.  Patient presented to the emergency room with confusion anxiety and depression possible vague suicidal thoughts without intent.  On interview today feeling better.  Denies acute suicidality.  Mood improved.  Still not feeling 100%.  Not impulsive.  Showing good insight agreeable to treatment.  Continued Clinical Symptoms:  Alcohol Use Disorder Identification Test Final Score (AUDIT): 5 The "Alcohol Use Disorders Identification Test", Guidelines for Use in Primary Care, Second Edition.  World Science writerHealth Organization  [facility]WHO). Score between 0-7:  no or low risk or alcohol related problems. Score between 8-15:  moderate risk of alcohol related problems. Score between 16-19:  high risk of alcohol related problems. Score 20 or above:  warrants further diagnostic evaluation for alcohol dependence and treatment.   CLINICAL FACTORS:   Depression:   Anhedonia   Musculoskeletal: Strength & Muscle Tone: within normal limits Gait & Station: normal Patient leans: N/A  Psychiatric Specialty Exam: Physical Exam  Nursing note and vitals reviewed. Constitutional: She appears well-developed and well-nourished.  HENT:  Head: Normocephalic and atraumatic.  Eyes: Pupils are equal, round, and reactive to light. Conjunctivae are normal.  Neck: Normal range of motion.  Cardiovascular: Normal heart sounds.  Respiratory: Effort normal.  GI: Soft.   Musculoskeletal: Normal range of motion.  Neurological: She is alert.  Skin: Skin is warm and dry.  Psychiatric: Her mood appears anxious. Her affect is blunt. Her speech is delayed. She is slowed. Thought content is paranoid. She expresses impulsivity. She expresses no homicidal and no suicidal ideation. She exhibits abnormal recent memory.    Review of Systems  Constitutional: Negative.   HENT: Negative.   Eyes: Negative.   Respiratory: Negative.   Cardiovascular: Negative.   Gastrointestinal: Negative.   Musculoskeletal: Negative.   Skin: Negative.   Neurological: Negative.   Psychiatric/Behavioral: Positive for depression, hallucinations and memory loss. Negative for substance abuse and suicidal ideas. The patient is nervous/anxious and has insomnia.     Blood pressure 114/88, pulse (!) 116, temperature 98.5 F (36.9 C), temperature source Oral, resp. rate 19, height 5\' 4"  (1.626 m), weight 73.9 kg, last menstrual period 02/02/2019, SpO2 100 %.Body mass index is 27.98 kg/m.  General Appearance: Casual  Eye Contact:  Good  Speech:  Slow  Volume:  Decreased  Mood:  Anxious and Depressed  Affect:  Congruent  Thought Process:  Disorganized  Orientation:  Full (Time, Place, and Person)  Thought Content:  Rumination and Tangential  Suicidal Thoughts:  No  Homicidal Thoughts:  No  Memory:  Immediate;   Fair Recent;   Poor Remote;   Fair  Judgement:  Fair  Insight:  Fair  Psychomotor Activity:  Decreased  Concentration:  Concentration: Fair  Recall:  [employer]  Fund of Knowledge:  Fair  Language:  Fair  Akathisia:  No  Handed:  Right  AIMS (if indicated):     Assets:  Desire for Improvement Housing Physical Health Resilience Social Support  ADL's:  Impaired  Cognition:  Impaired,  Mild  Sleep:         COGNITIVE FEATURES THAT CONTRIBUTE TO RISK:  Thought constriction (tunnel vision)    SUICIDE RISK:   Mild:  Suicidal ideation of limited frequency, intensity,  duration, and specificity.  There are no identifiable plans, no associated intent, mild dysphoria and related symptoms, good self-control (both objective and subjective assessment), few other risk factors, and identifiable protective factors, including available and accessible social support.  PLAN OF CARE: Patient admitted.  15-minute checks will be continued.  Starting medication for presumed psychotic depression.  Psychoeducation and individual counseling daily.  Group counseling daily.  Collateral information if possible.  Make sure patient is safe and reassess suicidality prior to discharge  I certify that inpatient services furnished can reasonably be expected to improve the patient's condition.   Alethia Berthold, MD 02/07/2019, 3:11 PM

## 2019-02-07 NOTE — [facility] Suicide Risk Assessment (Signed)
[place] INPATIENT:  Family/Significant Other Suicide Prevention Education  Suicide Prevention Education:  Education Completed; Lorenda Peck, mother [phone] has been identified by the patient as the family member/significant other with whom the patient will be residing, and identified as the person(s) who will aid the patient in the event of a mental health crisis (suicidal ideations/suicide attempt).  With written consent from the patient, the family member/significant other has been provided the following suicide prevention education, prior to the and/or following the discharge of the patient.  The suicide prevention education provided includes the following:  Suicide risk factors  Suicide prevention and interventions  National Suicide Hotline telephone number  [facility] assessment telephone number  [facility] Emergency Assistance [place] and/or Residential Mobile Crisis Unit telephone number  Request made of family/significant other to:  Remove weapons (e.g., guns, rifles, knives), all items previously/currently identified as safety concern.    Remove drugs/medications (over-the-counter, prescriptions, illicit drugs), all items previously/currently identified as a safety concern.  The family member/significant other verbalizes understanding of the suicide prevention education information provided.  The family member/significant other agrees to remove the items of safety concern listed above.  Juliann Pulse reported that pt is in the hospital because she couldn't make decisions for herself and they were worried about pt hurting herself. Juliann Pulse expressed no concerns with pt returning home or HI. Juliann Pulse denied guns/weapons being in the home. When asked if she had concerns for SI for pt, Juliann Pulse reported "I hope not". Juliann Pulse requested for the doctor to give her a call.   [place] MSW LCSW 02/07/2019, 10:50 AM
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
## 2019-02-07 NOTE — Plan of Care (Signed)
  Problem: Education: Goal: Ability to state activities that reduce stress will improve Outcome: Progressing  Patient participating in treatment plan , interacting with peers and staff appropriately

## 2019-02-07 NOTE — [facility] Group Notes (Signed)
Feelings Around Diagnosis 6/16/20201PM  Type of Therapy/Topic:  Group Therapy:  Feelings about Diagnosis  Participation Level:  Active   Description of Group:   This group will allow patients to explore their thoughts and feelings about diagnoses they have received. Patients will be guided to explore their level of understanding and acceptance of these diagnoses. Facilitator will encourage patients to process their thoughts and feelings about the reactions of others to their diagnosis and will guide patients in identifying ways to discuss their diagnosis with significant others in their lives. This group will be process-oriented, with patients participating in exploration of their own experiences, giving and receiving support, and processing challenge from other group members.   Therapeutic Goals: 1. Patient will demonstrate understanding of diagnosis as evidenced by identifying two or more symptoms of the disorder 2. Patient will be able to express two feelings regarding the diagnosis 3. Patient will demonstrate their ability to communicate their needs through discussion and/or role play  Summary of Patient Progress:  Actively and appropriately engaged in the group. Patient was able to provide support and validation to other group members. Patient identified depression as her diagnosis and discussed the stress she has been experiencing raising her five children. Patient states she is looking forward to receiving individual counseling to help her manage her stressors.     Therapeutic Modalities:   Cognitive Behavioral Therapy Brief Therapy Feelings Identification    Yvette Rack, LCSW 02/07/2019 1:59 PM

## 2019-02-07 NOTE — Progress Notes (Signed)
Recreation Therapy Notes  Date: 02/07/2019  Time: 9:30 am   Location: Craft room   Behavioral response: N/A   Intervention Topic: Coping skills  Discussion/Intervention: Patient did not attend group.   Clinical Observations/Feedback:  Patient did not attend group.   Darthy Manganelli LRT/CTRS        Kemba Hoppes 02/07/2019 11:10 AM

## 2019-02-07 NOTE — [facility] Counselor (Signed)
Adult Comprehensive Assessment  Patient ID: Cheryl Cook, female   DOB: 12-May-1981, 38 y.o.   MRN: [id number]  Information Source: Information source: Patient  Current Stressors:  Patient states their primary concerns and needs for treatment are:: "I been feeling depressed" Patient states their goals for this hospitilization and ongoing recovery are:: "To get myself straightened out and back on track" Educational / Learning stressors: 8th grade education Employment / Job issues: unemployed Museum/gallery curator / Lack of resources (include bankruptcy): depend on family no finances Substance abuse: Pt reports being sober for 5 years  Living/Environment/Situation:  Living Arrangements: Parent, Children Living conditions (as described by patient or guardian): "it's good" Who else lives in the home?: Pts mother and pts 5 children How long has patient lived in current situation?: 4-5 years  Family History:  Marital status: Single Are you sexually active?: Yes What is your sexual orientation?: heterosexual Has your sexual activity been affected by drugs, alcohol, medication, or emotional stress?: pt denies Does patient have children?: Yes How many children?: 5 How is patient's relationship with their children?: ages 48, 84, 52, 68 and 79. Pt reports a good relationship with all her children.  Childhood History:  By whom was/is the patient raised?: Mother Additional childhood history information: Pt reports not meeting her father until she was 54 yo Description of patient's relationship with caregiver when they were a child: "I was mama's baby" Patient's description of current relationship with people who raised him/her: "it's still the same" Does patient have siblings?: Yes Number of Siblings: 1 Description of patient's current relationship with siblings: Pt has an older sister who she reports a good relationship with Did patient suffer any verbal/emotional/physical/sexual abuse as a child?: No Did  patient suffer from severe childhood neglect?: No Has patient ever been sexually abused/assaulted/raped as an adolescent or adult?: No Was the patient ever a victim of a crime or a disaster?: No Witnessed domestic violence?: No Has patient been effected by domestic violence as an adult?: No  Education:  Highest grade of school patient has completed: 8th Currently a student?: No Learning disability?: Yes What learning problems does patient have?: Pt reports she cant read good or understand all questions she is asked"  Employment/Work Situation:   Employment situation: Unemployed(since December 2019) What is the longest time patient has a held a job?: 12 years Where was the patient employed at that time?: [employer] Did You Receive Any Psychiatric Treatment/Services While in the [employer]?: No Are There Guns or Other Weapons in [place]?: No Are These Psychologist, educational?: (N/A)  Financial Resources:   Museum/gallery curator resources: Physicist, medical, Support from parents / caregiver Does patient have a Programmer, applications or guardian?: No  Alcohol/Substance Abuse:   What has been your use of drugs/alcohol within the last 12 months?: pt reports being sober for 5.5 years If attempted suicide, did drugs/alcohol play a role in this?: No Alcohol/Substance Abuse Treatment Hx: Denies past history Has alcohol/substance abuse ever caused legal problems?: No  Social Support System:   Patient's Community Support System: Good Describe Community Support System: Pt reports her family as supports Type of faith/religion: International aid/development worker:   Leisure and Hobbies: "spend time with my kids"  Strengths/Needs:   What is the patient's perception of their strengths?: "cook, clean, do yard work" Patient states they can use these personal strengths during their treatment to contribute to their recovery: "actually do those things because it helps clear my mind" Patient states these barriers may  affect/interfere  with their treatment: pt denies Patient states these barriers may affect their return to the community: pt denies  Discharge Plan:   Currently receiving community mental health services: No Patient states concerns and preferences for aftercare planning are: pt wants [place] in [facility] Patient states they will know when they are safe and ready for discharge when: "soon as I start feeling better" Does patient have access to transportation?: Yes Does patient have financial barriers related to discharge medications?: No Will patient be returning to same living situation after discharge?: Yes  Summary/Recommendations:   Summary and Recommendations (to be completed by the evaluator): Pt is a 38 yo [place], [place] [facility] with her mother and 5 children. Pt presents to the hospital seeking treatment for suicidal ideations, confusion, and depression. Pt has a diagnosis of "acute psychosis". Pt is single, with 5 children, unemployed, denies trauma/abuse in childhood, and reports a good support system. [facility]. Pt denies SI/HI/AVH currently. Recommendations for pt include: crisis stabilization, therapeutic milieu, encourage group attendance and participation, medication management for mood stabilization, and development for comprehensive mental wellness plan. CSW assessing for appropriate referrals.  Charlann LangeOlivia K Thomasenia Dowse MSW LCSW 02/07/2019 10:48 AM

## 2019-02-07 NOTE — Progress Notes (Signed)
Recreation Therapy Notes  INPATIENT RECREATION THERAPY ASSESSMENT  Patient Details Name: Cheryl Cook MRN: [id number] DOB: [birth year] Today's Date: 02/07/2019       Information Obtained From: Patient  Able to Participate in Assessment/Interview: Yes  Patient Presentation: Responsive  Reason for Admission (Per Patient): Active Symptoms, Med Non-Compliance, Suicidal Ideation  Patient Stressors:    Coping Skills:   Exercise, Other (Comment), Talk(Clean)  Leisure Interests (2+):  Exercise - Walking, Social - Family  Frequency of Recreation/Participation: Monthly  Awareness of Community Resources:  Yes  Community Resources:  Park  Current Use:    If no, Barriers?:    Expressed Interest in [place] of Residence:  Insurance underwriter  Patient Main Form of Transportation: Musician  Patient Strengths:  Helping others  Patient Identified Areas of Improvement:  Stay on medication  Patient Goal for Hospitalization:  Get everything under control  Current SI (including self-harm):  No  Current HI:  No  Current AVH: No  Staff Intervention Plan: Group Attendance, Collaborate with Interdisciplinary Treatment Team  Consent to Intern Participation: N/A  Cheryl Cook 02/07/2019, 2:42 PM

## 2019-02-07 NOTE — Plan of Care (Signed)
D- Patient alert and oriented. Patient presents in a pleasant mood on assessment stating that she slept ok last night and had no complaints or concerns to voice to this Probation officer. Patient rated on her self-inventory a depression/anxiety level of "7/10" stating that "missing my kids" and "my anxiety does kick in sometimes" is why she reported these feelings. Patient denies SI, HI, AVH, and pain stating that she's feeling "fine right now". Patient's goal for today is "feeling better and doing better".  A- Scheduled medications administered to patient, per MD orders. Support and encouragement provided.  Routine safety checks conducted every 15 minutes.  Patient informed to notify staff with problems or concerns.  R- No adverse drug reactions noted. Patient contracts for safety at this time. Patient compliant with medications and treatment plan. Patient receptive, calm, and cooperative. Patient interacts well with others on the unit.  Patient remains safe at this time.  Problem: Education: Goal: Utilization of techniques to improve thought processes will improve Outcome: Progressing Goal: Knowledge of the prescribed therapeutic regimen will improve Outcome: Progressing   Problem: Activity: Goal: Interest or engagement in leisure activities will improve Outcome: Progressing Goal: Imbalance in normal sleep/wake cycle will improve Outcome: Progressing   Problem: Coping: Goal: Coping ability will improve Outcome: Progressing Goal: Will verbalize feelings Outcome: Progressing   Problem: Education: Goal: Ability to state activities that reduce stress will improve Outcome: Progressing   Problem: Coping: Goal: Ability to identify and develop effective coping behavior will improve Outcome: Progressing   Problem: Self-Concept: Goal: Ability to identify factors that promote anxiety will improve Outcome: Progressing Goal: Level of anxiety will decrease Outcome: Progressing Goal: Ability to modify  response to factors that promote anxiety will improve Outcome: Progressing   Problem: Education: Goal: Ability to make informed decisions regarding treatment will improve Outcome: Progressing   Problem: Coping: Goal: Coping ability will improve Outcome: Progressing   Problem: Health Behavior/Discharge Planning: Goal: Identification of resources available to assist in meeting health care needs will improve Outcome: Progressing   Problem: Medication: Goal: Compliance with prescribed medication regimen will improve Outcome: Progressing   Problem: Self-Concept: Goal: Ability to disclose and discuss suicidal ideas will improve Outcome: Progressing Goal: Will verbalize positive feelings about self Outcome: Progressing

## 2019-02-08 MED ORDER — OLANZAPINE 10 MG PO TABS: 10.0000 mg | ORAL_TABLET | Freq: Every day | ORAL | 0 refills | Status: DC

## 2019-02-08 MED ORDER — CITALOPRAM HYDROBROMIDE 20 MG PO TABS: 20.0000 mg | ORAL_TABLET | Freq: Every day | ORAL | 0 refills | Status: DC

## 2019-02-08 NOTE — Plan of Care (Signed)
  Problem: Education: Goal: Utilization of techniques to improve thought processes will improve Outcome: Progressing Goal: Knowledge of the prescribed therapeutic regimen will improve Outcome: Progressing  D: Patient has been isolative to room and self. Affect flat. Mood sad. Denies SI, HI and AVH. Medication compliant. A: Continue to monitor for safety. R: Safety maintained.

## 2019-02-08 NOTE — Progress Notes (Signed)
[facility] MD Progress Note  02/08/2019 4:42 PM Cheryl Cook  MRN:  [id number] Subjective: Follow-up for this patient with depression and transient psychosis.  Patient seen and she met with treatment team.  She reports that she is feeling much better.  Almost back to 100%.  Not having any hallucinations today.  Not feeling depressed.  Still somewhat anxious.  Able to attend groups and interact with people appropriately.  Denies any suicidal thoughts.  Has not shown any behavior problems on the unit.  Physically appears stable. Principal Problem: Severe recurrent major depression with psychotic features ([place]) Diagnosis: Principal Problem:   Severe recurrent major depression with psychotic features ([place]) Active Problems:   Acute psychosis ([place])   Marijuana abuse  Total Time spent with patient: 30 minutes  Past Psychiatric History: Past history of depression as an adolescent not known if there was any treatment in between but the patient has denied it  Past Medical History: History reviewed. No pertinent past medical history. History reviewed. No pertinent surgical history. Family History: History reviewed. No pertinent family history. Family Psychiatric  History: See previous Social History:  Social History   Substance and Sexual Activity  Alcohol Use No     Social History   Substance and Sexual Activity  Drug Use No    Social History   Socioeconomic History  . Marital status: Single    Spouse name: Not on file  . Number of children: Not on file  . Years of education: Not on file  . Highest education level: Not on file  Occupational History  . Not on file  Social Needs  . Financial resource strain: Not on file  . Food insecurity    Worry: Not on file    Inability: Not on file  . Transportation needs    Medical: Not on file    Non-medical: Not on file  Tobacco Use  . Smoking status: Current Every Day Smoker    Packs/day: 0.50    Types: Cigarettes  . Smokeless tobacco: Never  Used  Substance and Sexual Activity  . Alcohol use: No  . Drug use: No  . Sexual activity: Not on file  Lifestyle  . Physical activity    Days per week: Not on file    Minutes per session: Not on file  . Stress: Not on file  Relationships  . Social Herbalist on phone: Not on file    Gets together: Not on file    Attends religious service: Not on file    Active member of club or organization: Not on file    Attends meetings of clubs or organizations: Not on file    Relationship status: Not on file  Other Topics Concern  . Not on file  Social History Narrative  . Not on file   Additional Social History:                         Sleep: Fair  Appetite:  Fair  Current Medications: Current Facility-Administered Medications  Medication Dose Route Frequency Provider Last Rate Last Dose  . acetaminophen (TYLENOL) tablet 650 mg  650 mg Oral Q6H PRN Lavella Hammock, MD      . alum & mag hydroxide-simeth (MAALOX/MYLANTA) 200-200-20 MG/5ML suspension 30 mL  30 mL Oral Q4H PRN Lavella Hammock, MD      . citalopram (CELEXA) tablet 20 mg  20 mg Oral Daily Alexis Mizuno, [name], MD  20 mg at 02/08/19 0803  . magnesium hydroxide (MILK OF MAGNESIA) suspension 30 mL  30 mL Oral Daily PRN Lavella Hammock, MD      . magnesium oxide (MAG-OX) tablet 400 mg  400 mg Oral Daily Lavella Hammock, MD   400 mg at 02/08/19 0806  . multivitamin with minerals tablet 1 tablet  1 tablet Oral Daily Lavella Hammock, MD   1 tablet at 02/08/19 0803  . nicotine (NICODERM CQ - dosed in mg/24 hours) patch 21 mg  21 mg Transdermal Daily Sadee Osland, [name], MD   21 mg at 02/08/19 0803  . OLANZapine (ZYPREXA) tablet 10 mg  10 mg Oral QHS Nathaniel Yaden, [name], MD   10 mg at 02/07/19 2244  . potassium chloride SA (K-DUR) CR tablet 20 mEq  20 mEq Oral BID Lavella Hammock, MD   20 mEq at 02/08/19 1096    Lab Results: No results found for this or any previous visit (from the past 48 hour(s)).  Blood Alcohol  level:  Lab Results  Component Value Date   ETH <10 04/54/0981    Metabolic Disorder Labs: No results found for: HGBA1C, MPG No results found for: PROLACTIN Lab Results  Component Value Date   CHOL 134 02/06/2019   TRIG 77 02/06/2019   HDL 39 (L) 02/06/2019   CHOLHDL 3.4 02/06/2019   VLDL 15 02/06/2019   LDLCALC 80 02/06/2019    Physical Findings: AIMS: Facial and Oral Movements Muscles of Facial Expression: None, normal Lips and Perioral Area: None, normal Jaw: None, normal Tongue: None, normal,Extremity Movements Upper (arms, wrists, hands, fingers): None, normal Lower (legs, knees, ankles, toes): None, normal, Trunk Movements Neck, shoulders, hips: None, normal, Overall Severity Severity of abnormal movements (highest score from questions above): None, normal Incapacitation due to abnormal movements: None, normal Patient's awareness of abnormal movements (rate only patient's report): No Awareness, Dental Status Current problems with teeth and/or dentures?: No Does patient usually wear dentures?: No  CIWA:  CIWA-Ar Total: 2 COWS:  COWS Total Score: 4  Musculoskeletal: Strength & Muscle Tone: within normal limits Gait & Station: normal Patient leans: N/A  Psychiatric Specialty Exam: Physical Exam  Nursing note and vitals reviewed. Constitutional: She appears well-developed and well-nourished.  HENT:  Head: Normocephalic and atraumatic.  Eyes: Pupils are equal, round, and reactive to light. Conjunctivae are normal.  Neck: Normal range of motion.  Cardiovascular: Regular rhythm and normal heart sounds.  Respiratory: Effort normal. No respiratory distress.  GI: Soft.  Musculoskeletal: Normal range of motion.  Neurological: She is alert.  Skin: Skin is warm and dry.  Psychiatric: Her speech is normal and behavior is normal. Judgment and thought content normal. Her mood appears anxious. Cognition and memory are normal.    Review of Systems  Constitutional:  Negative.   HENT: Negative.   Eyes: Negative.   Respiratory: Negative.   Cardiovascular: Negative.   Gastrointestinal: Negative.   Musculoskeletal: Negative.   Skin: Negative.   Neurological: Negative.   Psychiatric/Behavioral: Negative.     Blood pressure 125/77, pulse (!) 118, temperature 98.6 F (37 C), temperature source Oral, resp. rate 19, height _0  (1.626 m), weight 73.9 kg, last menstrual period 02/02/2019, SpO2 99 %.Body mass index is 27.98 kg/m.  General Appearance: Casual  Eye Contact:  Good  Speech:  Clear and Coherent  Volume:  Normal  Mood:  Euthymic  Affect:  Congruent  Thought Process:  Goal Directed  Orientation:  Full (Time, Place, and  Person)  Thought Content:  Logical  Suicidal Thoughts:  No  Homicidal Thoughts:  No  Memory:  Immediate;   Fair Recent;   Fair Remote;   Fair  Judgement:  Fair  Insight:  Fair  Psychomotor Activity:  Decreased  Concentration:  Concentration: Fair  Recall:  [employer] of Knowledge:  Fair  Language:  Fair  Akathisia:  No  Handed:  Right  AIMS (if indicated):     Assets:  Desire for Improvement Housing Physical Health  ADL's:  Intact  Cognition:  WNL  Sleep:  Number of Hours: 6.15     Treatment Plan Summary: Daily contact with patient to assess and evaluate symptoms and progress in treatment, Medication management and Plan Patient tolerating medicine well.  Started on Zyprexa and citalopram.  Slept well last night.  No sign of agitation or confusion today.  Patient met with treatment team and is agreeable to discharge planning with follow-up in the community.  Likely discharge tomorrow.  No change of medicine tonight.  Alethia Berthold, MD 02/08/2019, 4:42 PM

## 2019-02-08 NOTE — Plan of Care (Signed)
Patient is alert and oriented X 3, denies SI, HI and AVH. Patient is moderately confused asking for "observation reports". Patient instructed by RN that upon discharge patient will receive information about hospital stay and medication list. Patient seemed pleased with information. Patient seems to be bizarre in behavior, denies Auditory hallucinations but seems to be responding to internal stimulus at times. Safety checks Q 15 minutes to continue. Problem: Education: Goal: Utilization of techniques to improve thought processes will improve Outcome: Progressing Goal: Knowledge of the prescribed therapeutic regimen will improve Outcome: Progressing   Problem: Activity: Goal: Interest or engagement in leisure activities will improve Outcome: Progressing Goal: Imbalance in normal sleep/wake cycle will improve Outcome: Progressing   Problem: Coping: Goal: Coping ability will improve Outcome: Progressing Goal: Will verbalize feelings Outcome: Progressing   Problem: Education: Goal: Ability to state activities that reduce stress will improve Outcome: Progressing

## 2019-02-08 NOTE — [facility] Suicide Risk Assessment (Signed)
[facility] Discharge Suicide Risk Assessment   Principal Problem: Severe recurrent major depression with psychotic features [facility]) Discharge Diagnoses: Principal Problem:   Severe recurrent major depression with psychotic features ([place]) Active Problems:   Acute psychosis ([place])   Marijuana abuse   Total Time spent with patient: 45 minutes  Musculoskeletal: Strength & Muscle Tone: within normal limits Gait & Station: normal Patient leans: N/A  Psychiatric Specialty Exam: Review of Systems  Constitutional: Negative.   HENT: Negative.   Eyes: Negative.   Respiratory: Negative.   Cardiovascular: Negative.   Gastrointestinal: Negative.   Musculoskeletal: Negative.   Skin: Negative.   Neurological: Negative.   Psychiatric/Behavioral: Negative.     Blood pressure 125/77, pulse (!) 118, temperature 98.6 F (37 C), temperature source Oral, resp. rate 19, height 5\' 4"  (1.626 m), weight 73.9 kg, last menstrual period 02/02/2019, SpO2 99 %.Body mass index is 27.98 kg/m.  General Appearance: Casual  Eye Contact::  Good  Speech:  Clear and [id number]  Volume:  Normal  Mood:  Euthymic  Affect:  Congruent  Thought Process:  Goal Directed  Orientation:  Full (Time, Place, and Person)  Thought Content:  Logical  Suicidal Thoughts:  No  Homicidal Thoughts:  No  Memory:  Immediate;   Fair Recent;   Fair Remote;   Fair  Judgement:  Fair  Insight:  Fair  Psychomotor Activity:  Decreased  Concentration:  Fair  Recall:  [employer] of Knowledge:Fair  Language: Fair  Akathisia:  No  Handed:  Right  AIMS (if indicated):     Assets:  Desire for Improvement Housing Physical Health  Sleep:  Number of Hours: 6.15  Cognition: WNL  ADL's:  Intact   Mental Status Per Nursing Assessment::   On Admission:  NA  Demographic Factors:  Caucasian and Unemployed  Loss Factors: Financial problems/change in socioeconomic status  Historical Factors: Prior suicide attempts  Risk Reduction  Factors:   Responsible for children under 48 years of age, Sense of responsibility to family, Religious beliefs about death, Living with another person, especially a relative and Positive social support  Continued Clinical Symptoms:  Depression:   Impulsivity  Cognitive Features That Contribute To Risk:  Loss of executive function    Suicide Risk:  Minimal: No identifiable suicidal ideation.  Patients presenting with no risk factors but with morbid ruminations; may be classified as minimal risk based on the severity of the depressive symptoms  Follow-up Information    [facility] Follow up.   Why: Please follow up with Sherrian Divers for peer support services on  Contact information: [place] [phone]           Plan Of Care/Follow-up recommendations:  Activity:  Activity as tolerated Diet:  Regular diet Other:  Follow-up with outpatient treatment at [facility] continue current medicine.  Alethia Berthold, MD 02/08/2019, 4:45 PM

## 2019-02-08 NOTE — BHH Group Notes (Signed)
LCSW Group Therapy Note  02/08/2019 1:00 PM  Type of Therapy/Topic:  Group Therapy:  Emotion Regulation  Participation Level:  Active   Description of Group:   The purpose of this group is to assist patients in learning to regulate negative emotions and experience positive emotions. Patients will be guided to discuss ways in which they have been vulnerable to their negative emotions. These vulnerabilities will be juxtaposed with experiences of positive emotions or situations, and patients will be challenged to use positive emotions to combat negative ones. Special emphasis will be placed on coping with negative emotions in conflict situations, and patients will process healthy conflict resolution skills.  Therapeutic Goals: 1. Patient will identify two positive emotions or experiences to reflect on in order to balance out negative emotions 2. Patient will label two or more emotions that they find the most difficult to experience 3. Patient will demonstrate positive conflict resolution skills through discussion and/or role plays  Summary of Patient Progress: Patient was present in group and was attentive. Patient shared how she has worked to regulate her emotions. Patient shared how she has worked to limit her exposure to the news as it is anxiety producing.  Patient shared how she uses prayer and alone time to cope.  Therapeutic Modalities:   Cognitive Behavioral Therapy Feelings Identification Dialectical Behavioral Therapy  Assunta Curtis, MSW, LCSW 02/08/2019 12:44 PM

## 2019-02-08 NOTE — Progress Notes (Signed)
Recreation Therapy Notes    Date: 02/08/2019   Time: 9:30 am   Location: Craft room   Behavioral response: N/A   Intervention Topic: Anger Management   Discussion/Intervention: Patient did not attend group.   Clinical Observations/Feedback:  Patient did not attend group.   Emmilynn Marut LRT/CTRS          Catelynn Sparger 02/08/2019 10:28 AM

## 2019-02-08 NOTE — Tx Team (Addendum)
Interdisciplinary Treatment and Diagnostic Plan Update  02/08/2019 Time of Session: 230PM Cheryl Cook MRN: [id number]  Principal Diagnosis: Severe recurrent major depression with psychotic features [facility]  Secondary Diagnoses: Principal Problem:   Severe recurrent major depression with psychotic features (Cheryl Cook) Active Problems:   Acute psychosis (Cheryl Cook)   Marijuana abuse   Current Medications:  Current Facility-Administered Medications  Medication Dose Route Frequency Provider Last Rate Last Dose  . acetaminophen (TYLENOL) tablet 650 mg  650 mg Oral Q6H PRN Lavella Hammock, MD      . alum & mag hydroxide-simeth (MAALOX/MYLANTA) 200-200-20 MG/5ML suspension 30 mL  30 mL Oral Q4H PRN Lavella Hammock, MD      . citalopram (CELEXA) tablet 20 mg  20 mg Oral Daily Clapacs, [name], MD   20 mg at 02/08/19 0803  . magnesium hydroxide (MILK OF MAGNESIA) suspension 30 mL  30 mL Oral Daily PRN Lavella Hammock, MD      . magnesium oxide (MAG-OX) tablet 400 mg  400 mg Oral Daily Lavella Hammock, MD   400 mg at 02/08/19 0806  . multivitamin with minerals tablet 1 tablet  1 tablet Oral Daily Lavella Hammock, MD   1 tablet at 02/08/19 0803  . nicotine (NICODERM CQ - dosed in mg/24 hours) patch 21 mg  21 mg Transdermal Daily Clapacs, [name], MD   21 mg at 02/08/19 0803  . OLANZapine (ZYPREXA) tablet 10 mg  10 mg Oral QHS Clapacs, [name], MD   10 mg at 02/07/19 2244  . potassium chloride SA (K-DUR) CR tablet 20 mEq  20 mEq Oral BID Lavella Hammock, MD   20 mEq at 02/08/19 0803   PTA Medications: Medications Prior to Admission  Medication Sig Dispense Refill Last Dose  . Multiple Vitamin (MULTIVITAMIN WITH MINERALS) TABS tablet Take 1 tablet by mouth daily.       Patient Stressors: Financial difficulties Health problems Occupational concerns  Patient Strengths: Average or above average intelligence Motivation for treatment/growth Supportive family/friends  Treatment Modalities: Medication  Management, Group therapy, Case management,  1 to 1 session with clinician, Psychoeducation, Recreational therapy.   Physician Treatment Plan for Primary Diagnosis: Severe recurrent major depression with psychotic features (Cheryl Cook) Long Term Goal(s): Improvement in symptoms so as ready for discharge Improvement in symptoms so as ready for discharge   Short Term Goals: Ability to verbalize feelings will improve Ability to disclose and discuss suicidal ideas Compliance with prescribed medications will improve Ability to identify triggers associated with substance abuse/mental health issues will improve  Medication Management: Evaluate patient's response, side effects, and tolerance of medication regimen.  Therapeutic Interventions: 1 to 1 sessions, Unit Group sessions and Medication administration.  Evaluation of Outcomes: Progressing  Physician Treatment Plan for Secondary Diagnosis: Principal Problem:   Severe recurrent major depression with psychotic features (Cheryl Cook) Active Problems:   Acute psychosis (Cheryl Cook)   Marijuana abuse  Long Term Goal(s): Improvement in symptoms so as ready for discharge Improvement in symptoms so as ready for discharge   Short Term Goals: Ability to verbalize feelings will improve Ability to disclose and discuss suicidal ideas Compliance with prescribed medications will improve Ability to identify triggers associated with substance abuse/mental health issues will improve     Medication Management: Evaluate patient's response, side effects, and tolerance of medication regimen.  Therapeutic Interventions: 1 to 1 sessions, Unit Group sessions and Medication administration.  Evaluation of Outcomes: Progressing   RN Treatment Plan for Primary Diagnosis: Severe  recurrent major depression with psychotic features (Cheryl Cook) Long Term Goal(s): Knowledge of disease and therapeutic regimen to maintain health will improve  Short Term Goals: Ability to demonstrate  self-control, Ability to participate in decision making will improve and Ability to verbalize feelings will improve  Medication Management: RN will administer medications as ordered by provider, will assess and evaluate patient's response and provide education to patient for prescribed medication. RN will report any adverse and/or side effects to prescribing provider.  Therapeutic Interventions: 1 on 1 counseling sessions, Psychoeducation, Medication administration, Evaluate responses to treatment, Monitor vital signs and CBGs as ordered, Perform/monitor CIWA, COWS, AIMS and Fall Risk screenings as ordered, Perform wound care treatments as ordered.  Evaluation of Outcomes: Progressing   LCSW Treatment Plan for Primary Diagnosis: Severe recurrent major depression with psychotic features (Cheryl Cook) Long Term Goal(s): Safe transition to appropriate next level of care at discharge, Engage patient in therapeutic group addressing interpersonal concerns.  Short Term Goals: Engage patient in aftercare planning with referrals and resources, Increase emotional regulation and Increase skills for wellness and recovery  Therapeutic Interventions: Assess for all discharge needs, 1 to 1 time with Social worker, Explore available resources and support systems, Assess for adequacy in community support network, Educate family and significant other(s) on suicide prevention, Complete Psychosocial Assessment, Interpersonal group therapy.  Evaluation of Outcomes: Progressing   Progress in Treatment: Attending groups: Yes. Participating in groups: Yes. Taking medication as prescribed: Yes. Toleration medication: Yes. Family/Significant other contact made: Yes, individual(s) contacted:  pts mother Patient understands diagnosis: Yes. Discussing patient identified problems/goals with staff: Yes. Medical problems stabilized or resolved: Yes. Denies suicidal/homicidal ideation: Yes. Issues/concerns per patient  self-inventory: No. Other: N/A  New problem(s) identified: No, Describe:  none  New Short Term/Long Term Goal(s):  elimination of AVH/symptoms of psychosis, medication management for mood stabilization; development of comprehensive mental wellness/sobriety plan.   Patient Goals:  "to get my mind clear and feel normal"  Discharge Plan or Barriers: SPE pamphlet, Mobile Crisis information, and AA/NA information provided to patient for additional community support and resources. [facility] and will meet with Unk PintoHarvey Bryant for peer support services.  Reason for Continuation of Hospitalization: Medication stabilization  Estimated Length of Stay: Tomorrow 02/09/2019  Recreational Therapy: Patient Stressors: N/A  Patient Goal: Patient will engage in groups without prompting or encouragement from LRT x3 group sessions within 5 recreation therapy group sessions  Attendees: Patient: Earlean PolkaMary Trott 02/08/2019 3:06 PM  Physician: Dr Toni Amendlapacs MD 02/08/2019 3:06 PM  Nursing: Hulan AmatoGwen Farrish 02/08/2019 3:06 PM  RN Care Manager: 02/08/2019 3:06 PM  Social Worker: Zollie Scalelivia Moton LCSW 02/08/2019 3:06 PM  Recreational Therapist: Garret ReddishShay Neve Branscomb CTRS LRT 02/08/2019 3:06 PM  Other: Penni HomansMichaela Stanfield LCSW 02/08/2019 3:06 PM  Other:  02/08/2019 3:06 PM  Other: 02/08/2019 3:06 PM    Scribe for Treatment Team: Charlann Langelivia K Moton, LCSW 02/08/2019 3:06 PM

## 2019-02-08 NOTE — Progress Notes (Signed)
Patient alert and oriented x 3 with periods of confusion to situation, thoughts are disorganized and incoherent, she appears to have some delayed response when writer was interacting with her, she denies SI/HI/AVH but was noted responding to internal stimuli. Patient endorsed depression and rated it a 5/10 ( 0 low - high 10) . Patient was offered support and encouraged to attend evening wrap up group.  Patient didn't attend evening wrap group, no distress noted, compliant with evening medication regimen, 15 minutes safety checks maintained will continue to monitor.

## 2019-02-08 NOTE — Progress Notes (Signed)
D: Patient has been isolative to room and self. Affect flat. Mood sad. Denies SI, HI and AVH. Medication compliant. A: Continue to monitor for safety. R: Safety maintained.

## 2019-02-09 MED ORDER — ADULT MULTIVITAMIN W/MINERALS CH: 1.0000 | ORAL_TABLET | Freq: Every day | ORAL | 1 refills | Status: DC

## 2019-02-09 MED ORDER — OLANZAPINE 10 MG PO TABS: 10.0000 mg | ORAL_TABLET | Freq: Every day | ORAL | 1 refills | Status: DC

## 2019-02-09 MED ORDER — CITALOPRAM HYDROBROMIDE 20 MG PO TABS: 20.0000 mg | ORAL_TABLET | Freq: Every day | ORAL | 1 refills | Status: DC

## 2019-02-09 MED ORDER — MAGNESIUM OXIDE 400 (241.3 MG) MG PO TABS: 400.0000 mg | ORAL_TABLET | Freq: Every day | ORAL | 1 refills | Status: DC

## 2019-02-09 NOTE — Progress Notes (Signed)
  [facility] Adult Case Management Discharge Plan :  Will you be returning to the same living situation after discharge:  Yes,  home At discharge, do you have transportation home?: Yes,  will call ride Do you have the ability to pay for your medications: Yes,  mental health  Release of information consent forms completed and in the chart;   Patient to Follow up at: Follow-up Information    [facility] Follow up.   Why: Please follow up with Sherrian Divers for peer support services on 02/10/19 at 8:00AM. Thank you! Contact information: [place] [address] [phone]           Next level of care provider has access to [facility] and Suicide Prevention discussed: Yes,  SPE completed with pts mother  Have you used any form of tobacco in the last 30 days? (Cigarettes, Smokeless Tobacco, Cigars, and/or Pipes): Yes  Has patient been referred to the Quitline?: Patient refused referral  Patient has been referred for addiction treatment: N/A  Delfin Edis, LCSW 02/09/2019, 9:11 AM

## 2019-02-09 NOTE — Discharge Summary (Signed)
Physician Discharge Summary Note  Patient:  Cheryl LedererMary J Paulos is an 38 y.o., female MRN:  [id number] DOB:  12-10-1980 Patient phone:  [phone] (home)  Patient address:   [address] [place] [id number],  Total Time spent with patient: 45 minutes  Date of Admission:  02/06/2019 Date of Discharge: February 09, 2019  Reason for Admission: Admitted through the emergency room where she presented with confused disorganized thinking depression and possible suicidal ideation  Principal Problem: Severe recurrent major depression with psychotic features [facility]([name]) Discharge Diagnoses: Principal Problem:   Severe recurrent major depression with psychotic features ([name]) Active Problems:   Acute psychosis ([name])   Marijuana abuse   Past Psychiatric History: Past history of an episode of depression many years ago without intervening symptoms.  Past Medical History: History reviewed. No pertinent past medical history. History reviewed. No pertinent surgical history. Family History: History reviewed. No pertinent family history. Family Psychiatric  History: None reported Social History:  Social History   Substance and Sexual Activity  Alcohol Use No     Social History   Substance and Sexual Activity  Drug Use No    Social History   Socioeconomic History  . Marital status: Single    Spouse name: Not on file  . Number of children: Not on file  . Years of education: Not on file  . Highest education level: Not on file  Occupational History  . Not on file  Social Needs  . Financial resource strain: Not on file  . Food insecurity    Worry: Not on file    Inability: Not on file  . Transportation needs    Medical: Not on file    Non-medical: Not on file  Tobacco Use  . Smoking status: Current Every Day Smoker    Packs/day: 0.50    Types: Cigarettes  . Smokeless tobacco: Never Used  Substance and Sexual Activity  . Alcohol use: No  . Drug use: No  . Sexual activity: Not on file   Lifestyle  . Physical activity    Days per week: Not on file    Minutes per session: Not on file  . Stress: Not on file  Relationships  . Social Musicianconnections    Talks on phone: Not on file    Gets together: Not on file    Attends religious service: Not on file    Active member of club or organization: Not on file    Attends meetings of clubs or organizations: Not on file    Relationship status: Not on file  Other Topics Concern  . Not on file  Social History Narrative  . Not on file    Hospital Course: Patient admitted to the hospital.  15-minute checks were continued.  Patient did not show any dangerous suicidal or aggressive behavior during her time in the hospital.  She was cooperative with treatment and forthcoming with symptoms.  Patient was started on low-dose antipsychotic and antidepressant medicine.  She attended groups appropriately.  She reported that her symptoms improved.  Denied hallucinations after the first day.  Felt like she was thinking much more clearly.  Consistently denied suicidal ideation.  Showed good insight and judgment.  At the time of discharge patient is given a 7-day supply of her medicine as well as a prescription and referred to [facility].  Psychoeducation and counseling completed regarding the importance of following up with outpatient treatment  Physical Findings: AIMS: Facial and Oral Movements Muscles of Facial Expression: None, normal  Lips and Perioral Area: None, normal Jaw: None, normal Tongue: None, normal,Extremity Movements Upper (arms, wrists, hands, fingers): None, normal Lower (legs, knees, ankles, toes): None, normal, Trunk Movements Neck, shoulders, hips: None, normal, Overall Severity Severity of abnormal movements (highest score from questions above): None, normal Incapacitation due to abnormal movements: None, normal Patient's awareness of abnormal movements (rate only patient's report): No Awareness, Dental Status Current problems with  teeth and/or dentures?: No Does patient usually wear dentures?: No  CIWA:  CIWA-Ar Total: 2 COWS:  COWS Total Score: 4  Musculoskeletal: Strength & Muscle Tone: within normal limits Gait & Station: normal Patient leans: N/A  Psychiatric Specialty Exam: Physical Exam  Nursing note and vitals reviewed. Constitutional: She appears well-developed and well-nourished.  HENT:  Head: Normocephalic and atraumatic.  Eyes: Pupils are equal, round, and reactive to light. Conjunctivae are normal.  Neck: Normal range of motion.  Cardiovascular: Regular rhythm and normal heart sounds.  Respiratory: Effort normal.  GI: Soft.  Musculoskeletal: Normal range of motion.  Neurological: She is alert.  Skin: Skin is warm and dry.  Psychiatric: She has a normal mood and affect. Her speech is normal and behavior is normal. Judgment and thought content normal. Cognition and memory are normal.    Review of Systems  Constitutional: Negative.   HENT: Negative.   Eyes: Negative.   Respiratory: Negative.   Cardiovascular: Negative.   Gastrointestinal: Negative.   Musculoskeletal: Negative.   Skin: Negative.   Neurological: Negative.   Psychiatric/Behavioral: Negative.     Blood pressure (!) 118/92, pulse (!) 133, temperature 98 F (36.7 C), temperature source Oral, resp. rate 16, height 5\' 4"  (1.626 m), weight 73.9 kg, last menstrual period 02/02/2019, SpO2 100 %.Body mass index is 27.98 kg/m.  General Appearance: Casual  Eye Contact:  Good  Speech:  Clear and Coherent  Volume:  Normal  Mood:  Euthymic  Affect:  Congruent  Thought Process:  Goal Directed  Orientation:  Full (Time, Place, and Person)  Thought Content:  Logical  Suicidal Thoughts:  No  Homicidal Thoughts:  No  Memory:  Immediate;   Fair Recent;   Fair Remote;   Fair  Judgement:  Fair  Insight:  Fair  Psychomotor Activity:  Normal  Concentration:  Concentration: Fair  Recall:  Fair  Fund of Knowledge:  Fair  Language:   Fair  Akathisia:  No  Handed:  Right  AIMS (if indicated):     Assets:  Desire for Improvement Housing Physical Health  ADL's:  Intact  Cognition:  WNL  Sleep:  Number of Hours: 8.25     Have you used any form of tobacco in the last 30 days? (Cigarettes, Smokeless Tobacco, Cigars, and/or Pipes): Yes  Has this patient used any form of tobacco in the last 30 days? (Cigarettes, Smokeless Tobacco, Cigars, and/or Pipes) Yes, Yes, A prescription for an FDA-approved tobacco cessation medication was offered at discharge and the patient refused  Blood Alcohol level:  Lab Results  Component Value Date   ETH <10 02/04/2019    Metabolic Disorder Labs:  No results found for: HGBA1C, MPG No results found for: PROLACTIN Lab Results  Component Value Date   CHOL 134 02/06/2019   TRIG 77 02/06/2019   HDL 39 (L) 02/06/2019   CHOLHDL 3.4 02/06/2019   VLDL 15 02/06/2019   LDLCALC 80 02/06/2019    See Psychiatric Specialty Exam and Suicide Risk Assessment completed by Attending Physician prior to discharge.  Discharge destination:  Home  Is  patient on multiple antipsychotic therapies at discharge:  No   Has Patient had three or more failed trials of antipsychotic monotherapy by history:  No  Recommended Plan for Multiple Antipsychotic Therapies: NA  Discharge Instructions    Diet - low sodium heart healthy   Complete by: As directed    Increase activity slowly   Complete by: As directed      Allergies as of 02/09/2019   No Known Allergies     Medication List    TAKE these medications     Indication  citalopram 20 MG tablet Commonly known as: CELEXA Take 1 tablet (20 mg total) by mouth daily.  Indication: Depression   magnesium oxide 400 (241.3 Mg) MG tablet Commonly known as: MAG-OX Take 1 tablet (400 mg total) by mouth daily. Start taking on: February 10, 2019  Indication: Constipation   multivitamin with minerals Tabs tablet Take 1 tablet by mouth daily. Start taking  on: February 10, 2019  Indication: Vitamin Deficiency   OLANZapine 10 MG tablet Commonly known as: ZYPREXA Take 1 tablet (10 mg total) by mouth at bedtime.  Indication: Psychotic Depressive Illness      Follow-up Information    [facility] Follow up.   Why: Please follow up with Sherrian Divers for peer support services on 02/10/19 at 8:00AM. Thank you! Contact information: [place] [address] [phone]           Follow-up recommendations:  Activity:  Discharge home Diet:  Regular diet Other:  Follow-up with [facility] and appropriate outpatient treatment  Comments: Patient no longer meets commitment criteria does not appear acutely dangerous.  Signed: Alethia Berthold, MD 02/09/2019, 11:25 AM

## 2019-02-09 NOTE — Progress Notes (Signed)
Recreation Therapy Notes  INPATIENT RECREATION TR PLAN  Patient Details Name: Cheryl Cook MRN: [id number] DOB: 07/14/1981 Today's Date: 02/09/2019  Rec Therapy Plan Treatment times per week: at least 3 Estimated Length of Stay: 5-7 Days TR Treatment/Interventions: Group participation (Comment)  Discharge Criteria Pt will be discharged from therapy if:: Discharged Treatment plan/goals/alternatives discussed and agreed upon by:: Patient/family  Discharge Summary Short term goals set: Patient will engage in groups without prompting or encouragement from LRT x3 group sessions within 5 recreation therapy group sessions Short term goals met: Not met Reason goals not met: Patient did not attend any groups Therapeutic equipment acquired: N/A Reason patient discharged from therapy: Discharge from hospital Pt/family agrees with progress & goals achieved: Yes Date patient discharged from therapy: 02/09/19      02/09/2019, 1:14 PM  

## 2019-02-09 NOTE — Plan of Care (Signed)
  Problem: Education: Goal: Utilization of techniques to improve thought processes will improve Outcome: Adequate for Discharge Goal: Knowledge of the prescribed therapeutic regimen will improve Outcome: Adequate for Discharge   Problem: Coping: Goal: Coping ability will improve Outcome: Adequate for Discharge Goal: Will verbalize feelings Outcome: Adequate for Discharge   Problem: Activity: Goal: Interest or engagement in leisure activities will improve Outcome: Adequate for Discharge Goal: Imbalance in normal sleep/wake cycle will improve Outcome: Adequate for Discharge

## 2019-02-09 NOTE — Progress Notes (Signed)
Recreation Therapy Notes  Date: 02/09/2019  Time: 9:30 am   Location: Craft room   Behavioral response: N/A   Intervention Topic: Communication  Discussion/Intervention: Patient did not attend group.   Clinical Observations/Feedback:  Patient did not attend group.   Kyreese Chio LRT/CTRS        Danalee Flath 02/09/2019 11:03 AM 

## 2019-03-13 DIAGNOSIS — F1921 Other psychoactive substance dependence, in remission: Secondary | ICD-10-CM

## 2019-03-13 DIAGNOSIS — Z62810 Personal history of physical and sexual abuse in childhood: Secondary | ICD-10-CM

## 2019-03-13 DIAGNOSIS — Z8659 Personal history of other mental and behavioral disorders: Secondary | ICD-10-CM

## 2019-03-15 ENCOUNTER — Ambulatory Visit: Payer: Self-pay

## 2019-03-30 ENCOUNTER — Telehealth: Payer: Self-pay | Admitting: Pharmacy Technician

## 2019-03-30 NOTE — Telephone Encounter (Signed)
Patient failed to provide requested financial documentation.  Financial documentation is required in order to determine patient's eligibility for [facility]'s program.  No additional medication assistance will be provided until patient provides requested financial documentation.  Patient notified by letter.  Tasheem Elms Pharmacy Technician/Eligibility Specialist [facility] 

## 2019-04-24 ENCOUNTER — Other Ambulatory Visit: Payer: Self-pay

## 2019-09-25 ENCOUNTER — Emergency Department: Payer: Medicaid Other

## 2019-09-25 ENCOUNTER — Emergency Department
Admission: EM | Admit: 2019-09-25 | Discharge: 2019-09-26 | Disposition: A | Discharge status: Home / Self Care | Payer: Medicaid Other | Attending: Emergency Medicine | Admitting: Emergency Medicine

## 2019-09-25 ENCOUNTER — Other Ambulatory Visit: Payer: Self-pay

## 2019-09-25 DIAGNOSIS — U071 COVID-19: Secondary | ICD-10-CM | POA: Insufficient documentation

## 2019-09-25 DIAGNOSIS — R079 Chest pain, unspecified: Secondary | ICD-10-CM | POA: Insufficient documentation

## 2019-09-25 DIAGNOSIS — Z79899 Other long term (current) drug therapy: Secondary | ICD-10-CM | POA: Insufficient documentation

## 2019-09-25 DIAGNOSIS — F17210 Nicotine dependence, cigarettes, uncomplicated: Secondary | ICD-10-CM | POA: Diagnosis not present

## 2019-09-25 DIAGNOSIS — R531 Weakness: Secondary | ICD-10-CM | POA: Diagnosis present

## 2019-09-25 LAB — CBC WITH DIFFERENTIAL/PLATELET
Abs Immature Granulocytes: 0.02 10*3/uL (ref 0.00–0.07)
Basophils Absolute: 0.1 10*3/uL (ref 0.0–0.1)
Basophils Relative: 1 %
Eosinophils Absolute: 0.1 10*3/uL (ref 0.0–0.5)
Eosinophils Relative: 1 %
HCT: 43.6 % (ref 36.0–46.0)
Hemoglobin: 14.6 g/dL (ref 12.0–15.0)
Immature Granulocytes: 0 %
Lymphocytes Relative: 8 %
Lymphs Abs: 0.5 10*3/uL — ABNORMAL LOW (ref 0.7–4.0)
MCH: 30.5 pg (ref 26.0–34.0)
MCHC: 33.5 g/dL (ref 30.0–36.0)
MCV: 91.2 fL (ref 80.0–100.0)
Monocytes Absolute: 0.7 10*3/uL (ref 0.1–1.0)
Monocytes Relative: 11 %
Neutro Abs: 4.9 10*3/uL (ref 1.7–7.7)
Neutrophils Relative %: 79 %
Platelets: 272 10*3/uL (ref 150–400)
RBC: 4.78 MIL/uL (ref 3.87–5.11)
RDW: 12.9 % (ref 11.5–15.5)
WBC: 6.2 10*3/uL (ref 4.0–10.5)
nRBC: 0 % (ref 0.0–0.2)

## 2019-09-25 LAB — POCT PREGNANCY, URINE: Preg Test, Ur: NEGATIVE

## 2019-09-25 MED ORDER — ACETAMINOPHEN 500 MG PO TABS
1000.0000 mg | ORAL_TABLET | Freq: Once | ORAL | Status: AC
  Administered 2019-09-26: 1000 mg via ORAL
  Filled 2019-09-25: qty 2

## 2019-09-25 MED ORDER — SODIUM CHLORIDE 0.9 % IV BOLUS
1000.0000 mL | Freq: Once | INTRAVENOUS | Status: AC
  Administered 2019-09-26: 1000 mL via INTRAVENOUS

## 2019-09-25 NOTE — ED Triage Notes (Signed)
Pt arrives to ED via POV from home with c/o weakness, cough, fever, and chills. Pt reports she had a possible exposure to COVID at her workplace within the last week. Pt reports non-productive cough. Pt is febrile in Triage (100.4 oral) and tachycardiac (HR 145). Charge RN made aware for immediate bed placement.

## 2019-09-26 ENCOUNTER — Other Ambulatory Visit: Payer: Self-pay

## 2019-09-26 ENCOUNTER — Emergency Department: Payer: Medicaid Other

## 2019-09-26 ENCOUNTER — Encounter: Payer: Self-pay | Admitting: Radiology

## 2019-09-26 LAB — COMPREHENSIVE METABOLIC PANEL
ALT: 14 U/L (ref 0–44)
AST: 20 U/L (ref 15–41)
Albumin: 4.2 g/dL (ref 3.5–5.0)
Alkaline Phosphatase: 63 U/L (ref 38–126)
Anion gap: 12 (ref 5–15)
BUN: <5 mg/dL — ABNORMAL LOW (ref 6–20)
CO2: 18 mmol/L — ABNORMAL LOW (ref 22–32)
Calcium: 8.9 mg/dL (ref 8.9–10.3)
Chloride: 104 mmol/L (ref 98–111)
Creatinine, Ser: 0.96 mg/dL (ref 0.44–1.00)
GFR calc Af Amer: >60 mL/min (ref >60)
GFR calc non Af Amer: >60 mL/min (ref >60)
Glucose, Bld: 101 mg/dL — ABNORMAL HIGH (ref 70–99)
Potassium: 3.4 mmol/L — ABNORMAL LOW (ref 3.5–5.1)
Sodium: 134 mmol/L — ABNORMAL LOW (ref 135–145)
Total Bilirubin: 0.5 mg/dL (ref 0.3–1.2)
Total Protein: 7.8 g/dL (ref 6.5–8.1)

## 2019-09-26 LAB — URINALYSIS, COMPLETE (UACMP) WITH MICROSCOPIC
Bilirubin Urine: NEGATIVE
Glucose, UA: NEGATIVE mg/dL
Ketones, ur: 5 mg/dL — AB
Nitrite: NEGATIVE
Protein, ur: 30 mg/dL — AB
Specific Gravity, Urine: 1.019 (ref 1.005–1.030)
Squamous Epithelial / HPF: >50 — ABNORMAL HIGH (ref 0–5)
pH: 5 (ref 5.0–8.0)

## 2019-09-26 LAB — TROPONIN I (HIGH SENSITIVITY)
Troponin I (High Sensitivity): <2 ng/L (ref <18)
Troponin I (High Sensitivity): <2 ng/L (ref <18)

## 2019-09-26 LAB — PROCALCITONIN: Procalcitonin: <0.1 ng/mL

## 2019-09-26 LAB — FIBRIN DERIVATIVES D-DIMER (ARMC ONLY): Fibrin derivatives D-dimer (ARMC): 529.01 ng/mL (FEU) — ABNORMAL HIGH (ref 0.00–499.00)

## 2019-09-26 LAB — POC SARS CORONAVIRUS 2 AG: SARS Coronavirus 2 Ag: POSITIVE — AB

## 2019-09-26 MED ORDER — VITAMIN D 125 MCG (5000 UT) PO CAPS: 125.0000 ug | ORAL_CAPSULE | Freq: Every day | ORAL | 0 refills | Status: DC

## 2019-09-26 MED ORDER — IOHEXOL 350 MG/ML SOLN
75.0000 mL | Freq: Once | INTRAVENOUS | Status: AC | PRN
  Administered 2019-09-26: 75 mL via INTRAVENOUS

## 2019-09-26 MED ORDER — KETOROLAC TROMETHAMINE 30 MG/ML IJ SOLN
15.0000 mg | Freq: Once | INTRAMUSCULAR | Status: AC
  Administered 2019-09-26: 15 mg via INTRAVENOUS
  Filled 2019-09-26: qty 1

## 2019-09-26 MED ORDER — ONDANSETRON 4 MG PO TBDP: 4.0000 mg | ORAL_TABLET | Freq: Three times a day (TID) | ORAL | 0 refills | Status: DC | PRN

## 2019-09-26 MED ORDER — SODIUM CHLORIDE 0.9 % IV BOLUS
1000.0000 mL | Freq: Once | INTRAVENOUS | Status: AC
  Administered 2019-09-26: 1000 mL via INTRAVENOUS

## 2019-09-26 MED ORDER — VITAMIN C 1000 MG PO TABS: 1000.0000 mg | ORAL_TABLET | Freq: Every day | ORAL | 0 refills | Status: DC
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
## 2019-09-26 NOTE — Discharge Instructions (Signed)

## 2019-09-26 NOTE — ED Notes (Signed)
Pt ambulated per MD request.  O2 sat ranged from 97%-100% with ambulation.

## 2019-09-26 NOTE — ED Notes (Signed)
Pt reports her whole body is hurting and she has been getting weaker and weaker, starting around 3 pm 09/25/19.  She reports having a non-productive cough for a couple of months. Patient rates her pain 10/10 (head, chest and back).

## 2019-11-14 ENCOUNTER — Other Ambulatory Visit: Payer: Self-pay

## 2019-11-14 ENCOUNTER — Encounter: Payer: Self-pay | Admitting: Family Medicine

## 2019-11-14 ENCOUNTER — Ambulatory Visit (LOCAL_COMMUNITY_HEALTH_CENTER): Payer: Self-pay | Admitting: Family Medicine

## 2019-11-14 VITALS — BP 94/71 | Ht 63.0 in | Wt 176.6 lb

## 2019-11-14 DIAGNOSIS — Z7251 High risk heterosexual behavior: Secondary | ICD-10-CM

## 2019-11-14 DIAGNOSIS — Z113 Encounter for screening for infections with a predominantly sexual mode of transmission: Secondary | ICD-10-CM

## 2019-11-14 DIAGNOSIS — Z3009 Encounter for other general counseling and advice on contraception: Secondary | ICD-10-CM

## 2019-11-14 DIAGNOSIS — Z30013 Encounter for initial prescription of injectable contraceptive: Secondary | ICD-10-CM

## 2019-11-14 LAB — WET PREP FOR TRICH, YEAST, CLUE
Trichomonas Exam: NEGATIVE
Yeast Exam: NEGATIVE

## 2019-11-14 LAB — PREGNANCY, URINE: Preg Test, Ur: NEGATIVE

## 2019-11-14 MED ORDER — MEDROXYPROGESTERONE ACETATE 150 MG/ML IM SUSP
150.0000 mg | INTRAMUSCULAR | Status: AC
  Administered 2019-11-14 – 2020-09-05 (×3): 150 mg via INTRAMUSCULAR

## 2019-11-14 MED ORDER — LEVONORGESTREL 1.5 MG PO TABS: 1.5000 mg | ORAL_TABLET | Freq: Once | ORAL | 0 refills | Status: AC

## 2019-11-14 MED ORDER — LEVONORGESTREL 1.5 MG PO TABS: 1.5000 mg | ORAL_TABLET | Freq: Once | ORAL | Status: DC

## 2019-11-14 NOTE — Progress Notes (Signed)
Contraception/Family Planning VISIT ENCOUNTER NOTE  Subjective:   Cheryl Cook is a 39 y.o. [place] female here for reproductive life counseling.  Desires reliability from [facility]- previously on depo and Nexplanon. Did not like nexplanon.  Reports she does not want a pregnancy in the next year. Denies abnormal vaginal bleeding, discharge, pelvic pain, problems with intercourse or other gynecologic concerns.    3/4- started sexual relationship with new partner. Reports 20 unprotected sex episodes in last 2 weeks.  Last unprotected sex was 2-3 days ago.   Gynecologic History Patient's last menstrual period was 10/23/2019 (exact date). Contraception: none  Health Maintenance Due  Topic Date Due  . TETANUS/TDAP  Never done  . PAP SMEAR-Modifier  11/21/2018  . INFLUENZA VACCINE  Never done     The following portions of the patient's history were reviewed and updated as appropriate: allergies, current medications, past family history, past medical history, past social history, past surgical history and problem list.  Review of Systems Pertinent items are noted in HPI.   Objective:  BP 94/71   Ht 5\' 3"  (1.6 m)   Wt 176 lb 9.6 oz (80.1 kg)   LMP 10/23/2019 (Exact Date)   BMI 31.28 kg/m  Gen: well appearing, NAD HEENT: no scleral icterus CV: RR Lung: Normal WOB Ext: warm well perfused  PELVIC: Normal appearing external genitalia; normal appearing vaginal mucosa and cervix.  No abnormal discharge noted (thin, white Ph<4.5).  Pap smear obtained.  Normal uterine size, no other palpable masses, no uterine or adnexal tenderness.   Assessment and Plan:   Contraception counseling: Reviewed all forms of birth control options in the tiered based approach. available including abstinence; over the counter/barrier methods; hormonal contraceptive medication including pill, patch, ring, injection,contraceptive implant; hormonal and nonhormonal IUDs; permanent sterilization options including vasectomy and  the various tubal sterilization modalities. Risks, benefits, and typical effectiveness rates were reviewed.  Questions were answered.  Written information was also given to the patient to review.  Patient desires ECP and to start depo, this was prescribed for patient. She will follow up in 1 yr for surveillance.  She was told to call with any further questions, or with any concerns about this method of contraception.  Emphasized use of condoms 100% of the time for STI prevention.  Patient was offered ECP. ECP was accepted by the patient. ECP counseling was given - see RN documentation  1. Screening examination for venereal disease Treat wet mount per standing Declined bloodwork today - WET PREP FOR TRICH, YEAST, CLUE - Pregnancy, urine - Chlamydia/Gonorrhea [facility] - IGP, Aptima HPV  2. Unprotected sex At risk for pregnancy given frequent unprotected sex in the past month. Counseled about use of ECP to prevent a pregnancy from sex 2-3 days ago. Reviewed this will not disrupt a pregnancy > 27 days old. Patient also desired to start depo and take home UPT in 2 weeks and agreed to repeat UPT at next depo administration.  - Pregnancy, urine - levonorgestrel (PLAN B 1-STEP) tablet 1.5 mg  3. Encounter for initial prescription of injectable contraceptive Patient was counseled on risk of pregnancy given unprotected sex Reviewed use of ECP for unprotected sex 2-3 days prior. Counseled that ECP and depo will not effect pregnancy prior to this time. She was counseled to use condoms for 2 weeks with partner and take repeat home pregnancy test.  Reviewed she will need a urine pregnancy test prior to next depo. She voiced understanding.  - medroxyPROGESTERone (DEPO-PROVERA)  injection 150 mg   Please refer to After Visit Summary for other counseling recommendations.   Return in about 3 months (around 02/14/2020) for Depo, need urine pregnancy test at next depo.  Caren Macadam, MD [place]

## 2019-11-14 NOTE — Progress Notes (Signed)
Here today to start Depo. Last PE here was 01/12/2017, last Pap Smear was 11/21/2015, last Depo was 04/24/2016. Accepts STD screening, declines bloodwork. Tawny Hopping, RN

## 2019-11-18 LAB — IGP, APTIMA HPV
HPV Aptima: NEGATIVE
PAP Smear Comment: 0

## 2019-11-22 ENCOUNTER — Telehealth: Payer: Self-pay

## 2019-11-22 DIAGNOSIS — A749 Chlamydial infection, unspecified: Secondary | ICD-10-CM

## 2019-11-22 NOTE — Telephone Encounter (Signed)
Lab sent for scanning. Shiloh Swopes, RN  

## 2019-11-23 ENCOUNTER — Other Ambulatory Visit: Payer: Self-pay

## 2019-11-23 ENCOUNTER — Ambulatory Visit: Payer: Self-pay

## 2019-11-23 DIAGNOSIS — A5602 Chlamydial vulvovaginitis: Secondary | ICD-10-CM

## 2019-11-23 MED ORDER — AZITHROMYCIN 500 MG PO TABS
1000.0000 mg | ORAL_TABLET | Freq: Once | ORAL | Status: AC
  Administered 2019-11-23: 1000 mg via ORAL

## 2019-11-23 NOTE — Telephone Encounter (Signed)
TC to patient. Verified ID via password/SS#. Informed of positive chlamydia and need for tx. Instructed to eat before visit and have partner call for tx appt. Appt scheduled.Jalan Bodi, RN    

## 2020-01-20 ENCOUNTER — Other Ambulatory Visit: Payer: Self-pay

## 2020-01-20 ENCOUNTER — Emergency Department
Admission: EM | Admit: 2020-01-20 | Discharge: 2020-01-21 | Disposition: A | Discharge status: Home / Self Care | Payer: Medicaid Other | Attending: Emergency Medicine | Admitting: Emergency Medicine

## 2020-01-20 ENCOUNTER — Emergency Department: Payer: Medicaid Other

## 2020-01-20 DIAGNOSIS — E876 Hypokalemia: Secondary | ICD-10-CM | POA: Diagnosis not present

## 2020-01-20 DIAGNOSIS — F17210 Nicotine dependence, cigarettes, uncomplicated: Secondary | ICD-10-CM | POA: Diagnosis not present

## 2020-01-20 DIAGNOSIS — F29 Unspecified psychosis not due to a substance or known physiological condition: Secondary | ICD-10-CM | POA: Insufficient documentation

## 2020-01-20 DIAGNOSIS — Z20822 Contact with and (suspected) exposure to covid-19: Secondary | ICD-10-CM | POA: Insufficient documentation

## 2020-01-20 DIAGNOSIS — F209 Schizophrenia, unspecified: Secondary | ICD-10-CM | POA: Diagnosis not present

## 2020-01-20 DIAGNOSIS — Z79899 Other long term (current) drug therapy: Secondary | ICD-10-CM | POA: Insufficient documentation

## 2020-01-20 DIAGNOSIS — Z046 Encounter for general psychiatric examination, requested by authority: Secondary | ICD-10-CM | POA: Diagnosis present

## 2020-01-20 HISTORY — DX: Schizophrenia, unspecified: F20.9

## 2020-01-20 LAB — CBC
HCT: 42.6 % (ref 36.0–46.0)
Hemoglobin: 14.7 g/dL (ref 12.0–15.0)
MCH: 31.6 pg (ref 26.0–34.0)
MCHC: 34.5 g/dL (ref 30.0–36.0)
MCV: 91.6 fL (ref 80.0–100.0)
Platelets: 326 10*3/uL (ref 150–400)
RBC: 4.65 MIL/uL (ref 3.87–5.11)
RDW: 13.2 % (ref 11.5–15.5)
WBC: 12 10*3/uL — ABNORMAL HIGH (ref 4.0–10.5)
nRBC: 0 % (ref 0.0–0.2)

## 2020-01-20 LAB — COMPREHENSIVE METABOLIC PANEL
ALT: 14 U/L (ref 0–44)
AST: 21 U/L (ref 15–41)
Albumin: 4.2 g/dL (ref 3.5–5.0)
Alkaline Phosphatase: 66 U/L (ref 38–126)
Anion gap: 11 (ref 5–15)
BUN: 7 mg/dL (ref 6–20)
CO2: 20 mmol/L — ABNORMAL LOW (ref 22–32)
Calcium: 9.2 mg/dL (ref 8.9–10.3)
Chloride: 108 mmol/L (ref 98–111)
Creatinine, Ser: 0.94 mg/dL (ref 0.44–1.00)
GFR calc Af Amer: >60 mL/min (ref >60)
GFR calc non Af Amer: >60 mL/min (ref >60)
Glucose, Bld: 125 mg/dL — ABNORMAL HIGH (ref 70–99)
Potassium: 2.7 mmol/L — CL (ref 3.5–5.1)
Sodium: 139 mmol/L (ref 135–145)
Total Bilirubin: 0.9 mg/dL (ref 0.3–1.2)
Total Protein: 8 g/dL (ref 6.5–8.1)

## 2020-01-20 LAB — SALICYLATE LEVEL: Salicylate Lvl: <7 mg/dL — ABNORMAL LOW (ref 7.0–30.0)

## 2020-01-20 LAB — TROPONIN I (HIGH SENSITIVITY)
Troponin I (High Sensitivity): <2 ng/L (ref <18)
Troponin I (High Sensitivity): 2 ng/L (ref <18)

## 2020-01-20 LAB — ACETAMINOPHEN LEVEL: Acetaminophen (Tylenol), Serum: <10 ug/mL — ABNORMAL LOW (ref 10–30)

## 2020-01-20 LAB — MAGNESIUM: Magnesium: 2.2 mg/dL (ref 1.7–2.4)

## 2020-01-20 LAB — ETHANOL: Alcohol, Ethyl (B): <10 mg/dL (ref <10)

## 2020-01-20 LAB — SARS CORONAVIRUS 2 BY RT PCR (HOSPITAL ORDER, PERFORMED IN ~~LOC~~ HOSPITAL LAB): SARS Coronavirus 2: NEGATIVE

## 2020-01-20 MED ORDER — POTASSIUM CHLORIDE 10 MEQ/100ML IV SOLN
10.0000 meq | INTRAVENOUS | Status: AC
  Administered 2020-01-20 (×5): 10 meq via INTRAVENOUS
  Filled 2020-01-20 (×5): qty 100

## 2020-01-20 MED ORDER — ONDANSETRON 4 MG PO TBDP
4.0000 mg | ORAL_TABLET | Freq: Once | ORAL | Status: AC
  Administered 2020-01-20: 4 mg via ORAL
  Filled 2020-01-20: qty 1

## 2020-01-20 MED ORDER — LORAZEPAM 2 MG/ML IJ SOLN
2.0000 mg | Freq: Once | INTRAMUSCULAR | Status: AC
  Administered 2020-01-20: 2 mg via INTRAMUSCULAR
  Filled 2020-01-20: qty 1

## 2020-01-20 MED ORDER — DROPERIDOL 2.5 MG/ML IJ SOLN
5.0000 mg | Freq: Once | INTRAMUSCULAR | Status: AC
  Administered 2020-01-20: 5 mg via INTRAMUSCULAR
  Filled 2020-01-20: qty 2

## 2020-01-20 MED ORDER — POTASSIUM CHLORIDE CRYS ER 20 MEQ PO TBCR
40.0000 meq | EXTENDED_RELEASE_TABLET | Freq: Once | ORAL | Status: AC
  Administered 2020-01-20: 40 meq via ORAL
  Filled 2020-01-20: qty 2

## 2020-01-20 MED ORDER — LORAZEPAM 1 MG PO TABS
1.0000 mg | ORAL_TABLET | Freq: Once | ORAL | Status: AC
  Administered 2020-01-20: 1 mg via ORAL
  Filled 2020-01-20: qty 1

## 2020-01-20 NOTE — ED Notes (Signed)
Second Krider infusing

## 2020-01-20 NOTE — ED Notes (Signed)
Pt is sleeping, on IV fluid, pt is breathing WNL, no sign of cardiac or rep distress assessed. Will continue to monitor and support as needed.

## 2020-01-20 NOTE — ED Triage Notes (Signed)
Pt comes IVC with officer for "acting strangely" with hallucinations and delustions. Pt does not want to hurt herself. Per pt, "I have COPD and I gave it all to God and my whole body hurts". Pt crying in triage. Uncle with her in triage.

## 2020-01-20 NOTE — ED Notes (Signed)
Patient sleeping, IV hep lock placed to left upper arm. covid swab obtain, second trop drawn and sent. Patient placed on cardiac monitor, ekg to be completed and given to Md.

## 2020-01-20 NOTE — ED Notes (Signed)
Patient sleeping comfortably. K rider infusing sinus on monitor. Safety maintained will continue to monitor.

## 2020-01-20 NOTE — ED Notes (Signed)
Hourly rounding reveals patient in room. No complaints, stable, in no acute distress. Q15 minute rounds and monitoring via Rover and Officer to continue.   

## 2020-01-20 NOTE — ED Notes (Signed)
Pt refused xray 

## 2020-01-20 NOTE — ED Notes (Addendum)
Call received from patients mother            Delice Bison @ [phone])  reports patient with bizarre behavior x 1 week. States patient has not been eating and has been talking to someone not there. Hx of schizo.
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
## 2020-01-20 NOTE — Progress Notes (Signed)
Patient unable to be assessed will need medical clearance at this time. Please consult once medically cleared.

## 2020-01-20 NOTE — ED Notes (Signed)
Third bag of k-rider infusing.

## 2020-01-20 NOTE — ED Notes (Signed)
Patient refusing to take oral medications.Patient posturing and taking with hands. Security called to bedside to assist with IM ordered. Meds given awaiting for patient to de-escalate for placement of IV and IV meds ordered. . Patient in hall talking loud " stating she is charge of her body" Patient took off shirt, patient was escorted into a private room where she layed in bed and completely undressed.

## 2020-01-20 NOTE — ED Notes (Signed)
Assumed care of patient. Writer unable to understand patient when talking. Awaiting medical clearance and psych eval.

## 2020-01-20 NOTE — ED Notes (Signed)
Pt also c/o chest pain and "heart pain"

## 2020-01-21 NOTE — ED Notes (Signed)
Hourly rounding reveals patient in room,with eyes closed. No complaints, stable, in no acute distress. Q15 minute rounds and monitoring via Rover and Officer to continue.   

## 2020-01-21 NOTE — ED Notes (Signed)
Hourly rounding reveals patient in room. No complaints, stable, in no acute distress. Q15 minute rounds and monitoring via Rover and Officer to continue.   

## 2020-01-21 NOTE — Discharge Instructions (Addendum)
Please seek medical attention for any high fevers, chest pain, shortness of breath, change in behavior, persistent vomiting, bloody stool or any other new or concerning symptoms.  

## 2020-01-21 NOTE — ED Notes (Signed)
Meal tray provided.

## 2020-01-21 NOTE — ED Notes (Signed)
Pt discharged home. VS stable. All belongings returned to patient. Pt denies SI/HI. Discharge instructions given to pt. Pt signed for discharge.

## 2020-01-21 NOTE — ED Notes (Signed)
Pt agitated and wanting to know when she would see a doctor so she could leave.  RN informed pt she needed to speak with the psychiatrist. "I am not staying in this hospital."    Pt given pad and underwear.

## 2020-01-21 NOTE — ED Provider Notes (Signed)
Emergency Medicine Observation Re-evaluation Note  Cheryl Cook is a 39 y.o. female, seen on rounds today.  Pt initially presented to the ED for complaints of Psychiatric Evaluation Currently, the patient is resting in no acute distress.  Physical Exam  BP 107/68 (BP Location: Right Arm)   Pulse 96   Temp 99.5 F (37.5 C) (Oral)   Resp 16   Ht 5\' 3"  (1.6 m)   Wt 80 kg   SpO2 97%   BMI 31.24 kg/m  Physical Exam  ED Course / MDM  EKG:    I have reviewed the labs performed to date as well as medications administered while in observation.  Recent changes in the last 24 hours include no events overnight. Plan  Current plan is for psychiatric disposition. Patient is under full IVC at this time.   , MD 01/21/20 [phone]

## 2020-01-21 NOTE — ED Notes (Signed)
Pt given clothes and is changing out of burgundy scrubs.

## 2020-02-01 ENCOUNTER — Other Ambulatory Visit: Payer: Self-pay

## 2020-02-01 ENCOUNTER — Encounter: Payer: Self-pay | Admitting: Emergency Medicine

## 2020-02-01 ENCOUNTER — Emergency Department
Admission: EM | Admit: 2020-02-01 | Discharge: 2020-02-02 | Disposition: A | Discharge status: Other Acute Inpatient Hospital | Payer: Medicaid Other | Attending: Emergency Medicine | Admitting: Emergency Medicine

## 2020-02-01 DIAGNOSIS — F333 Major depressive disorder, recurrent, severe with psychotic symptoms: Secondary | ICD-10-CM | POA: Diagnosis present

## 2020-02-01 DIAGNOSIS — L299 Pruritus, unspecified: Secondary | ICD-10-CM | POA: Diagnosis not present

## 2020-02-01 DIAGNOSIS — Z8659 Personal history of other mental and behavioral disorders: Secondary | ICD-10-CM

## 2020-02-01 DIAGNOSIS — Z20822 Contact with and (suspected) exposure to covid-19: Secondary | ICD-10-CM | POA: Insufficient documentation

## 2020-02-01 DIAGNOSIS — F23 Brief psychotic disorder: Secondary | ICD-10-CM | POA: Diagnosis present

## 2020-02-01 DIAGNOSIS — Z62810 Personal history of physical and sexual abuse in childhood: Secondary | ICD-10-CM

## 2020-02-01 DIAGNOSIS — R232 Flushing: Secondary | ICD-10-CM | POA: Insufficient documentation

## 2020-02-01 DIAGNOSIS — F1921 Other psychoactive substance dependence, in remission: Secondary | ICD-10-CM | POA: Diagnosis present

## 2020-02-01 DIAGNOSIS — R350 Frequency of micturition: Secondary | ICD-10-CM | POA: Diagnosis not present

## 2020-02-01 DIAGNOSIS — F919 Conduct disorder, unspecified: Secondary | ICD-10-CM | POA: Diagnosis not present

## 2020-02-01 DIAGNOSIS — F17210 Nicotine dependence, cigarettes, uncomplicated: Secondary | ICD-10-CM | POA: Diagnosis not present

## 2020-02-01 DIAGNOSIS — R079 Chest pain, unspecified: Secondary | ICD-10-CM | POA: Diagnosis not present

## 2020-02-01 DIAGNOSIS — R4689 Other symptoms and signs involving appearance and behavior: Secondary | ICD-10-CM

## 2020-02-01 DIAGNOSIS — R4182 Altered mental status, unspecified: Secondary | ICD-10-CM | POA: Diagnosis present

## 2020-02-01 DIAGNOSIS — F1210 Cannabis abuse, uncomplicated: Secondary | ICD-10-CM | POA: Diagnosis present

## 2020-02-01 DIAGNOSIS — Z046 Encounter for general psychiatric examination, requested by authority: Secondary | ICD-10-CM | POA: Diagnosis not present

## 2020-02-01 DIAGNOSIS — F22 Delusional disorders: Secondary | ICD-10-CM | POA: Insufficient documentation

## 2020-02-01 DIAGNOSIS — F209 Schizophrenia, unspecified: Secondary | ICD-10-CM | POA: Diagnosis not present

## 2020-02-01 LAB — CBC
HCT: 43.9 % (ref 36.0–46.0)
Hemoglobin: 15.1 g/dL — ABNORMAL HIGH (ref 12.0–15.0)
MCH: 31.7 pg (ref 26.0–34.0)
MCHC: 34.4 g/dL (ref 30.0–36.0)
MCV: 92 fL (ref 80.0–100.0)
Platelets: 324 10*3/uL (ref 150–400)
RBC: 4.77 MIL/uL (ref 3.87–5.11)
RDW: 13.4 % (ref 11.5–15.5)
WBC: 16.3 10*3/uL — ABNORMAL HIGH (ref 4.0–10.5)
nRBC: 0 % (ref 0.0–0.2)

## 2020-02-01 LAB — COMPREHENSIVE METABOLIC PANEL
ALT: 15 U/L (ref 0–44)
AST: 18 U/L (ref 15–41)
Albumin: 4.2 g/dL (ref 3.5–5.0)
Alkaline Phosphatase: 68 U/L (ref 38–126)
Anion gap: 10 (ref 5–15)
BUN: 7 mg/dL (ref 6–20)
CO2: 21 mmol/L — ABNORMAL LOW (ref 22–32)
Calcium: 9.1 mg/dL (ref 8.9–10.3)
Chloride: 105 mmol/L (ref 98–111)
Creatinine, Ser: 0.93 mg/dL (ref 0.44–1.00)
GFR calc Af Amer: >60 mL/min (ref >60)
GFR calc non Af Amer: >60 mL/min (ref >60)
Glucose, Bld: 154 mg/dL — ABNORMAL HIGH (ref 70–99)
Potassium: 3.1 mmol/L — ABNORMAL LOW (ref 3.5–5.1)
Sodium: 136 mmol/L (ref 135–145)
Total Bilirubin: 0.5 mg/dL (ref 0.3–1.2)
Total Protein: 8.2 g/dL — ABNORMAL HIGH (ref 6.5–8.1)

## 2020-02-01 LAB — URINALYSIS, COMPLETE (UACMP) WITH MICROSCOPIC
Bacteria, UA: NONE SEEN
Bilirubin Urine: NEGATIVE
Glucose, UA: NEGATIVE mg/dL
Ketones, ur: NEGATIVE mg/dL
Leukocytes,Ua: NEGATIVE
Nitrite: NEGATIVE
Protein, ur: NEGATIVE mg/dL
Specific Gravity, Urine: 1.002 — ABNORMAL LOW (ref 1.005–1.030)
pH: 6 (ref 5.0–8.0)

## 2020-02-01 LAB — URINE DRUG SCREEN, QUALITATIVE (ARMC ONLY)
Amphetamines, Ur Screen: NOT DETECTED
Barbiturates, Ur Screen: NOT DETECTED
Benzodiazepine, Ur Scrn: NOT DETECTED
Cannabinoid 50 Ng, Ur ~~LOC~~: POSITIVE — AB
Cocaine Metabolite,Ur ~~LOC~~: NOT DETECTED
MDMA (Ecstasy)Ur Screen: NOT DETECTED
Methadone Scn, Ur: NOT DETECTED
Opiate, Ur Screen: NOT DETECTED
Phencyclidine (PCP) Ur S: NOT DETECTED
Tricyclic, Ur Screen: NOT DETECTED

## 2020-02-01 LAB — PREGNANCY, URINE: Preg Test, Ur: NEGATIVE

## 2020-02-01 LAB — SARS CORONAVIRUS 2 BY RT PCR (HOSPITAL ORDER, PERFORMED IN ~~LOC~~ HOSPITAL LAB): SARS Coronavirus 2: NEGATIVE

## 2020-02-01 LAB — ETHANOL: Alcohol, Ethyl (B): <10 mg/dL (ref <10)

## 2020-02-01 NOTE — ED Notes (Signed)
Patient given a snack pack  

## 2020-02-01 NOTE — ED Notes (Signed)
Sister, Cheryl Cook, will be waiting in parking lot. Phone number [phone]  [name] states we can talk to her - "tell her anything."
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
## 2020-02-01 NOTE — ED Triage Notes (Addendum)
Pt from home with sister. Pt states "I know I'm pregnant; I just need to know how far along I am." Sister states that pt has been having hallucinations and delusions and that she thinks she killed someone. She also reports that pt may be on some drugs, but she is not sure what. Another sister is supposed to be at [facility] office taking IVC papers.   Pt denies hallucinations/delusions.
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
## 2020-02-01 NOTE — ED Notes (Signed)
Belongings: black nike sandals, white socks, black leggings, green t-shirt, multi-colored mask, green and black bra, purple panties,   Pt changed into blue paper scrubs, hospital socks and panties.

## 2020-02-01 NOTE — ED Notes (Signed)
Patient assigned to appropriate care area   Introduced self to pt  Patient oriented to unit/care area: Informed that, for their safety, care areas are designed for safety and visiting and phone hours explained to patient. Patient verbalizes understanding, and verbal contract for safety obtained  Environment secured  

## 2020-02-01 NOTE — ED Notes (Signed)
Pt brought into ED BHU via sally port and wand with metal detector for safety by [place] Security officer. Patient oriented to unit/care area: Pt informed of unit policies and procedures.  Informed that, for their safety, care areas are designed for safety and monitored by security cameras at all times. Patient verbalizes understanding, and verbal contract for safety obtained.Pt shown to their room.  

## 2020-02-02 ENCOUNTER — Inpatient Hospital Stay
Admission: EM | Admit: 2020-02-02 | Discharge: 2020-02-06 | DRG: 885 | Disposition: A | Discharge status: Home / Self Care | Payer: Medicaid Other | Source: Intra-hospital | Attending: Psychiatry | Admitting: Psychiatry

## 2020-02-02 ENCOUNTER — Other Ambulatory Visit: Payer: Self-pay

## 2020-02-02 DIAGNOSIS — E1165 Type 2 diabetes mellitus with hyperglycemia: Secondary | ICD-10-CM | POA: Diagnosis present

## 2020-02-02 DIAGNOSIS — Z20822 Contact with and (suspected) exposure to covid-19: Secondary | ICD-10-CM | POA: Diagnosis present

## 2020-02-02 DIAGNOSIS — Z79899 Other long term (current) drug therapy: Secondary | ICD-10-CM | POA: Diagnosis not present

## 2020-02-02 DIAGNOSIS — F329 Major depressive disorder, single episode, unspecified: Secondary | ICD-10-CM | POA: Diagnosis present

## 2020-02-02 DIAGNOSIS — F17210 Nicotine dependence, cigarettes, uncomplicated: Secondary | ICD-10-CM | POA: Diagnosis present

## 2020-02-02 DIAGNOSIS — E876 Hypokalemia: Secondary | ICD-10-CM | POA: Diagnosis present

## 2020-02-02 DIAGNOSIS — F200 Paranoid schizophrenia: Secondary | ICD-10-CM | POA: Diagnosis not present

## 2020-02-02 DIAGNOSIS — F23 Brief psychotic disorder: Secondary | ICD-10-CM | POA: Diagnosis present

## 2020-02-02 DIAGNOSIS — G4700 Insomnia, unspecified: Secondary | ICD-10-CM | POA: Diagnosis present

## 2020-02-02 DIAGNOSIS — F419 Anxiety disorder, unspecified: Secondary | ICD-10-CM | POA: Diagnosis present

## 2020-02-02 DIAGNOSIS — F1210 Cannabis abuse, uncomplicated: Secondary | ICD-10-CM | POA: Diagnosis present

## 2020-02-02 DIAGNOSIS — F209 Schizophrenia, unspecified: Secondary | ICD-10-CM | POA: Diagnosis not present

## 2020-02-02 DIAGNOSIS — F259 Schizoaffective disorder, unspecified: Secondary | ICD-10-CM | POA: Diagnosis present

## 2020-02-02 LAB — POCT PREGNANCY, URINE: Preg Test, Ur: NEGATIVE

## 2020-02-02 MED ORDER — DIPHENHYDRAMINE HCL 25 MG PO CAPS
ORAL_CAPSULE | ORAL | Status: AC
  Filled 2020-02-02: qty 2

## 2020-02-02 MED ORDER — OLANZAPINE 10 MG PO TABS
10.0000 mg | ORAL_TABLET | Freq: Every day | ORAL | Status: DC
  Administered 2020-02-02 – 2020-02-05 (×4): 10 mg via ORAL
  Filled 2020-02-02 (×4): qty 1

## 2020-02-02 MED ORDER — NICOTINE 21 MG/24HR TD PT24
21.0000 mg | MEDICATED_PATCH | Freq: Every day | TRANSDERMAL | Status: DC
  Administered 2020-02-02 – 2020-02-04 (×3): 21 mg via TRANSDERMAL
  Filled 2020-02-02 (×3): qty 1

## 2020-02-02 MED ORDER — PANTOPRAZOLE SODIUM 40 MG PO TBEC
40.0000 mg | DELAYED_RELEASE_TABLET | Freq: Every day | ORAL | Status: DC
  Administered 2020-02-02 – 2020-02-06 (×5): 40 mg via ORAL
  Filled 2020-02-02 (×6): qty 1

## 2020-02-02 MED ORDER — MAGNESIUM HYDROXIDE 400 MG/5ML PO SUSP: 30.0000 mL | Freq: Every day | ORAL | Status: DC | PRN

## 2020-02-02 MED ORDER — ALUM & MAG HYDROXIDE-SIMETH 200-200-20 MG/5ML PO SUSP: 30.0000 mL | ORAL | Status: DC | PRN

## 2020-02-02 MED ORDER — TRAZODONE HCL 50 MG PO TABS
50.0000 mg | ORAL_TABLET | Freq: Every evening | ORAL | Status: DC | PRN
  Administered 2020-02-02 – 2020-02-05 (×4): 50 mg via ORAL
  Filled 2020-02-02 (×4): qty 1

## 2020-02-02 MED ORDER — CITALOPRAM HYDROBROMIDE 20 MG PO TABS
20.0000 mg | ORAL_TABLET | Freq: Every day | ORAL | Status: DC
  Administered 2020-02-02 – 2020-02-06 (×5): 20 mg via ORAL
  Filled 2020-02-02 (×5): qty 1

## 2020-02-02 MED ORDER — ACETAMINOPHEN 325 MG PO TABS
650.0000 mg | ORAL_TABLET | Freq: Four times a day (QID) | ORAL | Status: DC | PRN
  Administered 2020-02-03: 650 mg via ORAL
  Filled 2020-02-02: qty 2

## 2020-02-02 MED ORDER — HYDROCORTISONE 1 % EX CREA
TOPICAL_CREAM | Freq: Two times a day (BID) | CUTANEOUS | Status: AC
  Filled 2020-02-02: qty 28

## 2020-02-02 MED ORDER — DIPHENHYDRAMINE HCL 25 MG PO CAPS
50.0000 mg | ORAL_CAPSULE | Freq: Once | ORAL | Status: AC
  Administered 2020-02-02: 50 mg via ORAL

## 2020-02-02 NOTE — H&P (Signed)
Psychiatric Admission Assessment Adult  Patient Identification: Cheryl Cook MRN:  [id number] Date of Evaluation:  02/02/2020 Chief Complaint:  Schizophrenia ([name]) [F20.9] Principal Diagnosis: Schizophrenia ([name]) Diagnosis:  Principal Problem:   Schizophrenia ([name]) Active Problems:   Acute psychosis ([name])   Marijuana abuse  History of Present Illness: Patient seen and chart reviewed.  39 year old woman brought to the emergency room by her family.  They filed commitment papers.  Family reports that the patient has been bizarre and delusional in her behavior.  Been going on a couple months.  Feeling like people are breaking into her house.  On presentation to the ER the patient believed she was pregnant although she now says that she has been convinced otherwise.  Patient at first minimizes or denies psychiatric symptoms but eventually at least tacitly admits that she has been feeling confused and paranoid for a while and had some difficulty sleeping.  She has been off of her psychiatric medicine for 2 or 3 months at least.  Denies alcohol or drug abuse.  Denies suicidal or homicidal thoughts. Associated Signs/Symptoms: Depression Symptoms:  difficulty concentrating, anxiety, (Hypo) Manic Symptoms:  Impulsivity, Labiality of Mood, Anxiety Symptoms:  Excessive Worry, Psychotic Symptoms:  Delusions, Paranoia, PTSD Symptoms: Negative Total Time spent with patient: 1 hour  Past Psychiatric History: Patient was admitted here about a year ago with psychotic symptoms and depressed symptoms and thought to have a psychotic depression.  Family however report a more longstanding consistent psychosis consistent with schizophrenia.  She was seen at [facility] after her last hospitalization and did fairly well until she stopped her medicine in the early spring  Is the patient at risk to self? Yes.    Has the patient been a risk to self in the past 6 months? No.  Has the patient been a risk to self within the  distant past? No.  Is the patient a risk to others? No.  Has the patient been a risk to others in the past 6 months? No.  Has the patient been a risk to others within the distant past? No.   Prior Inpatient Therapy:   Prior Outpatient Therapy:    Alcohol Screening: 1. How often do you have a drink containing alcohol?: Monthly or less 2. How many drinks containing alcohol do you have on a typical day when you are drinking?: 1 or 2 3. How often do you have six or more drinks on one occasion?: Less than monthly AUDIT-C Score: 2 4. How often during the last year have you found that you were not able to stop drinking once you had started?: Never 5. How often during the last year have you failed to do what was normally expected from you because of drinking?: Never 6. How often during the last year have you needed a first drink in the morning to get yourself going after a heavy drinking session?: Never 7. How often during the last year have you had a feeling of guilt of remorse after drinking?: Never 8. How often during the last year have you been unable to remember what happened the night before because you had been drinking?: Never 9. Have you or someone else been injured as a result of your drinking?: No 10. Has a relative or friend or a doctor or another health worker been concerned about your drinking or suggested you cut down?: No Alcohol Use Disorder Identification Test Final Score (AUDIT): 2 Alcohol Brief Interventions/Follow-up: AUDIT Score <7 follow-up not indicated Substance Abuse History  in the last 12 months:  No. Consequences of Substance Abuse: Negative Previous Psychotropic Medications: Yes  Psychological Evaluations: Yes  Past Medical History:  Past Medical History:  Diagnosis Date  . Schizophrenia ([name])    History reviewed. No pertinent surgical history. Family History:  Family History  Problem Relation Age of Onset  . Arthritis Mother   . Asthma Daughter   . Asthma Son    . Diabetes Maternal Grandmother   . Hypertension Maternal Grandmother   . Glaucoma Maternal Grandmother   . Diabetes Maternal Grandfather   . Hypertension Maternal Grandfather   . Glaucoma Maternal Grandfather   . Aneurysm Maternal Grandfather   . Congestive Heart Failure Maternal Grandfather   . Asthma Half-Sister    Family Psychiatric  History: Denies any Tobacco Screening: Have you used any form of tobacco in the last 30 days? (Cigarettes, Smokeless Tobacco, Cigars, and/or Pipes): Yes Tobacco use, Select all that apply: 5 or more cigarettes per day Are you interested in Tobacco Cessation Medications?: Yes, will notify MD for an order Counseled patient on smoking cessation including recognizing danger situations, developing coping skills and basic information about quitting provided: Refused/Declined practical counseling Social History:  Social History   Substance and Sexual Activity  Alcohol Use No     Social History   Substance and Sexual Activity  Drug Use No    Additional Social History:                           Allergies:  No Known Allergies Lab Results:  Results for orders placed or performed during the hospital encounter of 02/01/20 (from the past 48 hour(s))  Comprehensive metabolic panel     Status: Abnormal   Collection Time: 02/01/20  5:06 PM  Result Value Ref Range   Sodium 136 135 - 145 mmol/L   Potassium 3.1 (L) 3.5 - 5.1 mmol/L   Chloride 105 98 - 111 mmol/L   CO2 21 (L) 22 - 32 mmol/L   Glucose, Bld 154 (H) 70 - 99 mg/dL    Comment: Glucose reference range applies only to samples taken after fasting for at least 8 hours.   BUN 7 6 - 20 mg/dL   Creatinine, Ser 1.610.93 0.44 - 1.00 mg/dL   Calcium 9.1 8.9 - 09.610.3 mg/dL   Total Protein 8.2 (H) 6.5 - 8.1 g/dL   Albumin 4.2 3.5 - 5.0 g/dL   AST 18 15 - 41 U/L   ALT 15 0 - 44 U/L   Alkaline Phosphatase 68 38 - 126 U/L   Total Bilirubin 0.5 0.3 - 1.2 mg/dL   GFR calc non Af Amer >60 >60 mL/min    GFR calc Af Amer >60 >60 mL/min   Anion gap 10 5 - 15    Comment: Performed at [facility], [address] [place], [place] [address]  Ethanol     Status: None   Collection Time: 02/01/20  5:06 PM  Result Value Ref Range   Alcohol, Ethyl (B) <10 <10 mg/dL    Comment: (NOTE) Lowest detectable limit for serum alcohol is 10 mg/dL.  For medical purposes only. Performed at [facility], [address] [place], [place] [address]   cbc     Status: Abnormal   Collection Time: 02/01/20  5:06 PM  Result Value Ref Range   WBC 16.3 (H) 4.0 - 10.5 K/uL   RBC 4.77 3.87 - 5.11 MIL/uL   Hemoglobin 15.1 (  H) 12.0 - 15.0 g/dL   HCT 62.2 36 - 46 %   MCV 92.0 80.0 - 100.0 fL   MCH 31.7 26.0 - 34.0 pg   MCHC 34.4 30.0 - 36.0 g/dL   RDW 63.3 35.4 - 56.2 %   Platelets 324 150 - 400 K/uL   nRBC 0.0 0.0 - 0.2 %    Comment: Performed at [facility], [address], [place], [place] [address]  Urine Drug Screen, Qualitative     Status: Abnormal   Collection Time: 02/01/20  5:06 PM  Result Value Ref Range   Tricyclic, Ur Screen NONE DETECTED NONE DETECTED   Amphetamines, Ur Screen NONE DETECTED NONE DETECTED   MDMA (Ecstasy)Ur Screen NONE DETECTED NONE DETECTED   Cocaine Metabolite,Ur [place] NONE DETECTED NONE DETECTED   Opiate, Ur Screen NONE DETECTED NONE DETECTED   Phencyclidine (PCP) Ur S NONE DETECTED NONE DETECTED   Cannabinoid 50 Ng, Ur [place] POSITIVE (A) NONE DETECTED   Barbiturates, Ur Screen NONE DETECTED NONE DETECTED   Benzodiazepine, Ur Scrn NONE DETECTED NONE DETECTED   Methadone Scn, Ur NONE DETECTED NONE DETECTED    Comment: (NOTE) Tricyclics + metabolites, urine    Cutoff 1000 ng/mL Amphetamines + metabolites, urine  Cutoff 1000 ng/mL MDMA (Ecstasy), urine              Cutoff 500 ng/mL Cocaine Metabolite, urine          Cutoff 300 ng/mL Opiate + metabolites, urine        Cutoff 300 ng/mL Phencyclidine (PCP), urine         Cutoff 25 ng/mL Cannabinoid,  urine                 Cutoff 50 ng/mL Barbiturates + metabolites, urine  Cutoff 200 ng/mL Benzodiazepine, urine              Cutoff 200 ng/mL Methadone, urine                   Cutoff 300 ng/mL  The urine drug screen provides only a preliminary, unconfirmed analytical test result and should not be used for non-medical purposes. Clinical consideration and professional judgment should be applied to any positive drug screen result due to possible interfering substances. A more specific alternate chemical method must be used in order to obtain a confirmed analytical result. Gas chromatography / mass spectrometry (GC/MS) is the preferred confirm atory method. Performed at [facility], [address] [place], [place] [address]   Pregnancy, urine     Status: None   Collection Time: 02/01/20  5:06 PM  Result Value Ref Range   Preg Test, Ur NEGATIVE NEGATIVE    Comment: Performed at [facility], [address] [place], [place] [address]  Urinalysis, Complete w Microscopic     Status: Abnormal   Collection Time: 02/01/20  5:06 PM  Result Value Ref Range   Color, Urine STRAW (A) YELLOW   APPearance HAZY (A) CLEAR   Specific Gravity, Urine 1.002 (L) 1.005 - 1.030   pH 6.0 5.0 - 8.0   Glucose, UA NEGATIVE NEGATIVE mg/dL   Hgb urine dipstick SMALL (A) NEGATIVE   Bilirubin Urine NEGATIVE NEGATIVE   Ketones, ur NEGATIVE NEGATIVE mg/dL   Protein, ur NEGATIVE NEGATIVE mg/dL   Nitrite NEGATIVE NEGATIVE   Leukocytes,Ua NEGATIVE NEGATIVE   RBC / HPF 0-5 0 - 5 RBC/hpf   WBC, UA 0-5 0 - 5 WBC/hpf   Bacteria, UA NONE  SEEN NONE SEEN   Squamous Epithelial / LPF 0-5 0 - 5    Comment: Performed at [facility], [address] [place], [place] [address]  Pregnancy, urine POC     Status: None   Collection Time: 02/01/20  5:06 PM  Result Value Ref Range   Preg Test, Ur NEGATIVE NEGATIVE    Comment:        THE SENSITIVITY OF THIS METHODOLOGY IS >24 mIU/mL   SARS  Coronavirus 2 by RT PCR (hospital order, performed in [facility] hospital lab) Nasopharyngeal Nasopharyngeal Swab     Status: None   Collection Time: 02/01/20  7:55 PM   Specimen: Nasopharyngeal Swab  Result Value Ref Range   SARS Coronavirus 2 NEGATIVE NEGATIVE    Comment: (NOTE) SARS-CoV-2 target nucleic acids are NOT DETECTED.  The SARS-CoV-2 RNA is generally detectable in upper and lower respiratory specimens during the acute phase of infection. The lowest concentration of SARS-CoV-2 viral copies this assay can detect is 250 copies / mL. A negative result does not preclude SARS-CoV-2 infection and should not be used as the sole basis for treatment or other patient management decisions.  A negative result may occur with improper specimen collection / handling, submission of specimen other than nasopharyngeal swab, presence of viral mutation(s) within the areas targeted by this assay, and inadequate number of viral copies (<250 copies / mL). A negative result must be combined with clinical observations, patient history, and epidemiological information.  Fact Sheet for Patients:   BoilerBrush.com.cy  Fact Sheet for Healthcare Providers: https://pope.com/  This test is not yet approved or  cleared by the [place] FDA and has been authorized for detection and/or diagnosis of SARS-CoV-2 by FDA under an Emergency Use Authorization (EUA).  This EUA will remain in effect (meaning this test can be used) for the duration of the COVID-19 declaration under Section 564(b)(1) of the Act, 21 U.S.C. section 360bbb-3(b)(1), unless the authorization is terminated or revoked sooner.  Performed at [facility], [address] [place], [place] [address]     Blood Alcohol level:  Lab Results  Component Value Date   [facility] <10 02/01/2020   ETH <10 01/20/2020    Metabolic Disorder Labs:  No results found for: HGBA1C, MPG No  results found for: PROLACTIN Lab Results  Component Value Date   CHOL 134 02/06/2019   TRIG 77 02/06/2019   HDL 39 (L) 02/06/2019   CHOLHDL 3.4 02/06/2019   VLDL 15 02/06/2019   LDLCALC 80 02/06/2019    Current Medications: Current Facility-Administered Medications  Medication Dose Route Frequency Provider Last Rate Last Admin  . acetaminophen (TYLENOL) tablet 650 mg  650 mg Oral Q6H PRN Gillermo Murdoch, NP      . alum & mag hydroxide-simeth (MAALOX/MYLANTA) 200-200-20 MG/5ML suspension 30 mL  30 mL Oral Q4H PRN Gillermo Murdoch, NP      . citalopram (CELEXA) tablet 20 mg  20 mg Oral Daily Neasia Fleeman, Jackquline Denmark, MD   20 mg at 02/02/20 1658  . hydrocortisone cream 1 %   Topical BID Saide Lanuza T, MD      . magnesium hydroxide (MILK OF MAGNESIA) suspension 30 mL  30 mL Oral Daily PRN Gillermo Murdoch, NP      . nicotine (NICODERM CQ - dosed in mg/24 hours) patch 21 mg  21 mg Transdermal Daily Raynald Rouillard, Jackquline Denmark, MD   21 mg at 02/02/20 1658  . OLANZapine (ZYPREXA) tablet 10 mg  10 mg Oral  Juluis Pitch, NP      . pantoprazole (PROTONIX) EC tablet 40 mg  40 mg Oral Daily Yahshua Thibault, [name], MD   40 mg at 02/02/20 1658  . traZODone (DESYREL) tablet 50 mg  50 mg Oral QHS PRN Caroline Sauger, NP       PTA Medications: Facility-Administered Medications Prior to Admission  Medication Dose Route Frequency Provider Last Rate Last Admin  . medroxyPROGESTERone (DEPO-PROVERA) injection 150 mg  150 mg Intramuscular Q90 days Caren Macadam, MD   150 mg at 11/14/19 1213   Medications Prior to Admission  Medication Sig Dispense Refill Last Dose  . Multiple Vitamin (MULTIVITAMIN WITH MINERALS) TABS tablet Take 1 tablet by mouth daily. (Patient not taking: Reported on 02/01/2020) 30 tablet 1   . OLANZapine (ZYPREXA) 10 MG tablet Take 1 tablet (10 mg total) by mouth at bedtime. (Patient not taking: Reported on 09/26/2019) 30 tablet 1     Musculoskeletal: Strength & Muscle Tone:  within normal limits Gait & Station: normal Patient leans: Backward  Psychiatric Specialty Exam: Physical Exam  Nursing note and vitals reviewed. Constitutional: She appears well-developed.  HENT:  Head: Normocephalic and atraumatic.  Eyes: Pupils are equal, round, and reactive to light. Conjunctivae are normal.  Cardiovascular: Normal heart sounds.  Respiratory: Effort normal.  GI: Soft.  Musculoskeletal:        General: Normal range of motion.     Cervical back: Normal range of motion.  Neurological: She is alert.  Skin: Skin is warm and dry.  Psychiatric: Her speech is normal. Her mood appears anxious. She is agitated. Thought content is paranoid. She expresses impulsivity. She expresses no homicidal and no suicidal ideation. She is inattentive.    Review of Systems  Constitutional: Negative.   HENT: Negative.   Eyes: Negative.   Respiratory: Negative.   Cardiovascular: Negative.   Gastrointestinal: Negative.   Musculoskeletal: Negative.   Skin: Negative.   Neurological: Negative.   Psychiatric/Behavioral: Positive for confusion.    Blood pressure 103/78, pulse 69, temperature 98 F (36.7 C), temperature source Oral, resp. rate 16, height 5\' 3"  (1.6 m), weight 75.3 kg, SpO2 99 %.Body mass index is 29.41 kg/m.  General Appearance: Casual  Eye Contact:  Fair  Speech:  Blocked and Slow  Volume:  Decreased  Mood:  Anxious  Affect:  Inappropriate  Thought Process:  Disorganized  Orientation:  Full (Time, Place, and Person)  Thought Content:  Illogical and Paranoid Ideation  Suicidal Thoughts:  No  Homicidal Thoughts:  No  Memory:  Immediate;   Fair Recent;   Fair Remote;   Fair  Judgement:  Fair  Insight:  Shallow  Psychomotor Activity:  Decreased  Concentration:  Concentration: Poor  Recall:  [employer] of Knowledge:  Fair  Language:  Fair  Akathisia:  No  Handed:  Right  AIMS (if indicated):     Assets:  Desire for Improvement  ADL's:  Intact  Cognition:   WNL  Sleep:       Treatment Plan Summary: Plan Continue current medicines including olanzapine and citalopram.  Encourage patient with med compliance.  Encourage patient to attend groups on the unit.  Ongoing assessment of mental status and gathering of collateral information prior to making discharge planning.  Observation Level/Precautions:  15 minute checks  Laboratory:  Chemistry Profile  Psychotherapy:    Medications:    Consultations:    Discharge Concerns:    Estimated LOS:  Other:     Physician  Treatment Plan for Primary Diagnosis: Schizophrenia ([name]) Long Term Goal(s): Improvement in symptoms so as ready for discharge  Short Term Goals: Ability to identify and develop effective coping behaviors will improve and Compliance with prescribed medications will improve  Physician Treatment Plan for Secondary Diagnosis: Principal Problem:   Schizophrenia ([name]) Active Problems:   Acute psychosis ([name])   Marijuana abuse  Long Term Goal(s): Improvement in symptoms so as ready for discharge  Short Term Goals: Ability to verbalize feelings will improve and Ability to maintain clinical measurements within normal limits will improve  I certify that inpatient services furnished can reasonably be expected to improve the patient's condition.    Mordecai Rasmussen, MD 6/11/20215:08 PM

## 2020-02-02 NOTE — Progress Notes (Signed)
Patient ID: Cheryl Cook, female   DOB: Oct 08, 1980, 39 y.o.   MRN: [id number]  D: Patient is newly admitted to BMU. Patient's skin assessment completed with skylee RN, skin is intact, no contraband found. Patient denies SI/HI/AVH. Patient reports Smoking. Patient reports current stressors of unemployment and "trying to clean my house."  A: Patient oriented to unit/room/call light. Patient was offered support and encouragement. Patient was encourage to attend groups, participate in unit activities and continue with plan of care. Q x 15 minute observation checks were completed for safety.   R: Patient has no complaints at this time. Patient is receptive to treatment and safety maintained on unit.

## 2020-02-02 NOTE — Progress Notes (Signed)
Recreation Therapy Notes  INPATIENT RECREATION THERAPY ASSESSMENT  Patient Details Name: DONNIE GEDEON MRN: [id number] DOB: 08-08-1981 Today's Date: 02/02/2020       Information Obtained From: Patient  Able to Participate in Assessment/Interview: Yes  Patient Presentation: Responsive  Reason for Admission (Per Patient): Active Symptoms  Patient Stressors:    Coping Skills:   Loss adjuster, chartered, Other (Comment), Music (Eat, Clean)  Leisure Interests (2+):  Social - Family, Exercise - Walking, Music - Listen  Frequency of Recreation/Participation:    Awareness of Community Resources:  Yes  Community Resources:  Park  Current Use:    If no, Barriers?:    Expressed Interest in [employer] Information:    [place] of Residence:  Film/video editor  Patient Main Form of Transportation: Set designer  Patient Strengths:  Mother  Patient Identified Areas of Improvement:  Stop worrying so much  Patient Goal for Hospitalization:  To be discharged  Current SI (including self-harm):  No  Current HI:  No  Current AVH: No  Staff Intervention Plan: Group Attendance, Collaborate with Interdisciplinary Treatment Team  Consent to Intern Participation: N/A  Zaedyn Covin 02/02/2020, 2:33 PM

## 2020-02-02 NOTE — BHH Suicide Risk Assessment (Signed)
[facility] Admission Suicide Risk Assessment   Nursing information obtained from:  Patient Demographic factors:  NA Current Mental Status:  NA Loss Factors:  Financial problems / change in socioeconomic status Historical Factors:  NA Risk Reduction Factors:  NA  Total Time spent with patient: 1 hour Principal Problem: Schizophrenia (Cheryl Cook) Diagnosis:  Principal Problem:   Schizophrenia (Cheryl Cook) Active Problems:   Acute psychosis (Cheryl Cook)   Marijuana abuse  Subjective Data: Patient seen chart reviewed.  39 year old woman with a past history of psychotic disorder brought to the hospital and petition by her family with reports that she has been bizarre in her behavior and delusional.  On interview today the patient presents initially only with somatic complaints.  When we discussed the psychiatric complaints she denies most of it but eventually tacitly admits that she has not been thinking clearly.  Completely denies any suicidal or homicidal thoughts or intent.  Continued Clinical Symptoms:  Alcohol Use Disorder Identification Test Final Score (AUDIT): 2 The "Alcohol Use Disorders Identification Test", Guidelines for Use in Primary Care, Second Edition.  World Pharmacologist [facility]). Score between 0-7:  no or low risk or alcohol related problems. Score between 8-15:  moderate risk of alcohol related problems. Score between 16-19:  high risk of alcohol related problems. Score 20 or above:  warrants further diagnostic evaluation for alcohol dependence and treatment.   CLINICAL FACTORS:   Schizophrenia:   Paranoid or undifferentiated type   Musculoskeletal: Strength & Muscle Tone: within normal limits Gait & Station: normal Patient leans: N/A  Psychiatric Specialty Exam: Physical Exam  Nursing note and vitals reviewed. Constitutional: She appears well-developed.  HENT:  Head: Normocephalic and atraumatic.  Eyes: Pupils are equal, round, and reactive to light. Conjunctivae are normal.   Cardiovascular: Normal heart sounds.  Respiratory: Effort normal.  GI: Soft.  Musculoskeletal:        General: Normal range of motion.     Cervical back: Normal range of motion.  Neurological: She is alert.  Skin: Skin is warm and dry.  Psychiatric: Her affect is labile. She is slowed. Thought content is paranoid and delusional. She expresses impulsivity. She expresses no homicidal and no suicidal ideation. She is noncommunicative. She is inattentive.    Review of Systems  Constitutional: Negative.   HENT: Negative.   Eyes: Negative.   Respiratory: Negative.   Cardiovascular: Negative.   Gastrointestinal: Negative.   Musculoskeletal: Negative.   Skin: Negative.   Neurological: Negative.   Psychiatric/Behavioral: Negative.     Blood pressure 103/78, pulse 69, temperature 98 F (36.7 C), temperature source Oral, resp. rate 16, height 5\' 3"  (1.6 m), weight 75.3 kg, SpO2 99 %.Body mass index is 29.41 kg/m.  General Appearance: Casual  Eye Contact:  Fair  Speech:  Slow  Volume:  Decreased  Mood:  Euthymic  Affect:  Inappropriate  Thought Process:  Disorganized  Orientation:  Full (Time, Place, and Person)  Thought Content:  Illogical and Paranoid Ideation  Suicidal Thoughts:  No  Homicidal Thoughts:  No  Memory:  Immediate;   Fair Recent;   Fair Remote;   Fair  Judgement:  Impaired  Insight:  Shallow  Psychomotor Activity:  Decreased  Concentration:  Concentration: Fair  Recall:  [employer] of Knowledge:  Fair  Language:  Fair  Akathisia:  No  Handed:  Right  AIMS (if indicated):     Assets:  Desire for Improvement  ADL's:  Intact  Cognition:  WNL  Sleep:  COGNITIVE FEATURES THAT CONTRIBUTE TO RISK:  Loss of executive function    SUICIDE RISK:   Minimal: No identifiable suicidal ideation.  Patients presenting with no risk factors but with morbid ruminations; may be classified as minimal risk based on the severity of the depressive symptoms  PLAN OF  CARE: Continue antipsychotic and antidepressant medication.  Engage in daily assessment in groups.  Continue 15-minute checks.  Renew plans for outpatient treatment at discharge  I certify that inpatient services furnished can reasonably be expected to improve the patient's condition.   Mordecai Rasmussen, MD 02/02/2020, 5:04 PM

## 2020-02-02 NOTE — BH Assessment (Signed)
PATIENT BED AVAILABLE AFTER 9:30AM  Patient is to be admitted to [facility] by Psychiatric Nurse Practitioner Gillermo Murdoch.  Attending Physician will be Dr. Toni Amend.   Patient has been assigned to room 323, by [facility] Charge Nurse [place].   Intake Paper Work has been signed and placed on patient chart.  ER staff is aware of the admission:  [place] ER Secretary    Dr. Fuller Plan, ER MD   Dewayne Hatch Patient's Nurse   [facility] Patient Access.

## 2020-02-02 NOTE — ED Notes (Signed)
Report given to receiving nurse. Patient transferred to lower level BHU

## 2020-02-02 NOTE — BH Assessment (Signed)
Pt's sister Cyndia Skeeters) contacted TTS asking for an update. After receiving permission from Pt, TTS provided Cyndia Skeeters with an update. Cyndia Skeeters requesting pt be provided her number to contact her once she is settled on the unit. TTS completed request.

## 2020-02-02 NOTE — ED Notes (Signed)
Patient experiencing frequency, upon assessment reports feeling itchy. Redness and blotches noted to acw with flushed faced. ED MD made aware. Benadryl given. Will monitor for relief.

## 2020-02-02 NOTE — ED Notes (Signed)
Patient up early pacing in day rom,. Aware that he will be transfer to lower level BHU sometime this morning. Patient calm and cooperative. Safety maintained will monitor.

## 2020-02-02 NOTE — Tx Team (Signed)
Initial Treatment Plan 02/02/2020 9:25 AM Cheryl Cook [id number]    PATIENT STRESSORS: Educational concerns Financial difficulties   PATIENT STRENGTHS: Average or above average intelligence Capable of independent living   PATIENT IDENTIFIED PROBLEMS: Unstable mood  Ineffective coping skills                   DISCHARGE CRITERIA:  Ability to meet basic life and health needs Adequate post-discharge living arrangements  PRELIMINARY DISCHARGE PLAN: Return to previous living arrangement Return to previous work or school arrangements  PATIENT/FAMILY INVOLVEMENT: This treatment plan has been presented to and reviewed with the patient, Cheryl Cook.  The patient and family have been given the opportunity to ask questions and make suggestions.  Berkley Harvey, RN 02/02/2020, 9:25 AM
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
## 2020-02-03 DIAGNOSIS — F200 Paranoid schizophrenia: Secondary | ICD-10-CM

## 2020-02-03 LAB — CBC WITH DIFFERENTIAL/PLATELET
Abs Immature Granulocytes: 0.03 10*3/uL (ref 0.00–0.07)
Basophils Absolute: 0.1 10*3/uL (ref 0.0–0.1)
Basophils Relative: 1 %
Eosinophils Absolute: 0.3 10*3/uL (ref 0.0–0.5)
Eosinophils Relative: 2 %
HCT: 41.4 % (ref 36.0–46.0)
Hemoglobin: 13.8 g/dL (ref 12.0–15.0)
Immature Granulocytes: 0 %
Lymphocytes Relative: 26 %
Lymphs Abs: 3.4 10*3/uL (ref 0.7–4.0)
MCH: 31.4 pg (ref 26.0–34.0)
MCHC: 33.3 g/dL (ref 30.0–36.0)
MCV: 94.1 fL (ref 80.0–100.0)
Monocytes Absolute: 1.1 10*3/uL — ABNORMAL HIGH (ref 0.1–1.0)
Monocytes Relative: 9 %
Neutro Abs: 8.1 10*3/uL — ABNORMAL HIGH (ref 1.7–7.7)
Neutrophils Relative %: 62 %
Platelets: 321 10*3/uL (ref 150–400)
RBC: 4.4 MIL/uL (ref 3.87–5.11)
RDW: 13.5 % (ref 11.5–15.5)
WBC: 13 10*3/uL — ABNORMAL HIGH (ref 4.0–10.5)
nRBC: 0 % (ref 0.0–0.2)

## 2020-02-03 LAB — HEMOGLOBIN A1C
Hgb A1c MFr Bld: 6 % — ABNORMAL HIGH (ref 4.8–5.6)
Mean Plasma Glucose: 125.5 mg/dL

## 2020-02-03 LAB — TSH: TSH: 1.596 u[IU]/mL (ref 0.350–4.500)

## 2020-02-03 MED ORDER — POTASSIUM CHLORIDE CRYS ER 20 MEQ PO TBCR
20.0000 meq | EXTENDED_RELEASE_TABLET | Freq: Once | ORAL | Status: AC
  Administered 2020-02-03: 20 meq via ORAL
  Filled 2020-02-03: qty 1

## 2020-02-03 NOTE — Progress Notes (Signed)
[facility] MD Progress Note  02/03/2020 11:40 AM Cheryl Cook  MRN:  [id number] Subjective: Patient is a 39 year old female who is brought to the emergency department at the [facility] on 02/02/2020 under involuntary commitment.  The patient was paranoid, and thinking that she was pregnant.  She also felt like people were trying to break into her house.  Objective: Patient is seen and examined.  Patient is a 39 year old female with the above-stated past psychiatric history was seen in follow-up.  She stated she feels well today.  She slept well.  She admitted to having been off her psychiatric medications for approximately 2 to 3 months prior to admission.  She stated now that she has recognizing that she needs these medications.  She still appears to have some residual paranoia, but denied being pregnant this morning.  She denied auditory or visual hallucinations.  She denied any suicidal or homicidal ideation.  Her current medications include citalopram as well as Zyprexa.  Her vital signs are stable, she is afebrile.  Review of her admission laboratories revealed a low potassium at 3.1.  Glucose was elevated at 154.  Liver function enzymes were normal.  Her CBC showed an elevated white blood cell count at 16.3, but the rest of her CBC was normal.  Acetaminophen was less than 10, salicylate was less than 7.  Pregnancy test was negative.  Urinalysis showed no bacteria and no white blood cells.  Drug screen was positive for cannabinoids.  Her EKG from 5/29 showed a sinus arrhythmia with a QTc interval of 449.  Review of the electronic medical record revealed that on her last psychiatric hospitalization in our facility on 01/2019 she had been placed on citalopram and olanzapine with successful treatment.  Principal Problem: Schizophrenia ([place]) Diagnosis: Principal Problem:   Schizophrenia ([place]) Active Problems:   Acute psychosis ([place])   Marijuana abuse  Total Time spent with patient: 20  minutes  Past Psychiatric History: See admission H&P  Past Medical History:  Past Medical History:  Diagnosis Date  . Schizophrenia ([place])    History reviewed. No pertinent surgical history. Family History:  Family History  Problem Relation Age of Onset  . Arthritis Mother   . Asthma Daughter   . Asthma Son   . Diabetes Maternal Grandmother   . Hypertension Maternal Grandmother   . Glaucoma Maternal Grandmother   . Diabetes Maternal Grandfather   . Hypertension Maternal Grandfather   . Glaucoma Maternal Grandfather   . Aneurysm Maternal Grandfather   . Congestive Heart Failure Maternal Grandfather   . Asthma Half-Sister    Family Psychiatric  History: See admission H&P Social History:  Social History   Substance and Sexual Activity  Alcohol Use No     Social History   Substance and Sexual Activity  Drug Use No    Social History   Socioeconomic History  . Marital status: Single    Spouse name: Not on file  . Number of children: Not on file  . Years of education: Not on file  . Highest education level: Not on file  Occupational History  . Not on file  Tobacco Use  . Smoking status: Current Every Day Smoker    Packs/day: 0.50    Types: Cigarettes  . Smokeless tobacco: Never Used  Vaping Use  . Vaping Use: Never used  Substance and Sexual Activity  . Alcohol use: No  . Drug use: No  . Sexual activity: Yes    Birth control/protection: None  Other Topics Concern  . Not on file  Social History Narrative  . Not on file   Social Determinants of Health   Financial Resource Strain:   . Difficulty of Paying Living Expenses:   Food Insecurity:   . Worried About Programme researcher, broadcasting/film/video in the Last Year:   . Barista in the Last Year:   Transportation Needs:   . Freight forwarder (Medical):   Marland Kitchen Lack of Transportation (Non-Medical):   Physical Activity:   . Days of Exercise per Week:   . Minutes of Exercise per Session:   Stress:   . Feeling of  Stress :   Social Connections:   . Frequency of Communication with Friends and Family:   . Frequency of Social Gatherings with Friends and Family:   . Attends Religious Services:   . Active Member of Clubs or Organizations:   . Attends Banker Meetings:   Marland Kitchen Marital Status:    Additional Social History:                         Sleep: Good  Appetite:  Good  Current Medications: Current Facility-Administered Medications  Medication Dose Route Frequency Provider Last Rate Last Admin  . acetaminophen (TYLENOL) tablet 650 mg  650 mg Oral Q6H PRN Gillermo Murdoch, NP      . alum & mag hydroxide-simeth (MAALOX/MYLANTA) 200-200-20 MG/5ML suspension 30 mL  30 mL Oral Q4H PRN Gillermo Murdoch, NP      . citalopram (CELEXA) tablet 20 mg  20 mg Oral Daily Clapacs, Jackquline Denmark, MD   20 mg at 02/03/20 0803  . hydrocortisone cream 1 %   Topical BID Clapacs, Jackquline Denmark, MD   Given at 02/03/20 2878  . magnesium hydroxide (MILK OF MAGNESIA) suspension 30 mL  30 mL Oral Daily PRN Gillermo Murdoch, NP      . nicotine (NICODERM CQ - dosed in mg/24 hours) patch 21 mg  21 mg Transdermal Daily Clapacs, Jackquline Denmark, MD   21 mg at 02/03/20 0804  . OLANZapine (ZYPREXA) tablet 10 mg  10 mg Oral QHS Gillermo Murdoch, NP   10 mg at 02/02/20 2115  . pantoprazole (PROTONIX) EC tablet 40 mg  40 mg Oral Daily Clapacs, Jackquline Denmark, MD   40 mg at 02/03/20 0803  . traZODone (DESYREL) tablet 50 mg  50 mg Oral QHS PRN Gillermo Murdoch, NP   50 mg at 02/02/20 2115    Lab Results:  Results for orders placed or performed during the hospital encounter of 02/01/20 (from the past 48 hour(s))  Comprehensive metabolic panel     Status: Abnormal   Collection Time: 02/01/20  5:06 PM  Result Value Ref Range   Sodium 136 135 - 145 mmol/L   Potassium 3.1 (L) 3.5 - 5.1 mmol/L   Chloride 105 98 - 111 mmol/L   CO2 21 (L) 22 - 32 mmol/L   Glucose, Bld 154 (H) 70 - 99 mg/dL    Comment: Glucose reference range  applies only to samples taken after fasting for at least 8 hours.   BUN 7 6 - 20 mg/dL   Creatinine, Ser 6.76 0.44 - 1.00 mg/dL   Calcium 9.1 8.9 - 72.0 mg/dL   Total Protein 8.2 (H) 6.5 - 8.1 g/dL   Albumin 4.2 3.5 - 5.0 g/dL   AST 18 15 - 41 U/L   ALT 15 0 - 44 U/L   Alkaline Phosphatase  68 38 - 126 U/L   Total Bilirubin 0.5 0.3 - 1.2 mg/dL   GFR calc non Af Amer >60 >60 mL/min   GFR calc Af Amer >60 >60 mL/min   Anion gap 10 5 - 15    Comment: Performed at [facility], [address] [place], [place] [address]  Ethanol     Status: None   Collection Time: 02/01/20  5:06 PM  Result Value Ref Range   Alcohol, Ethyl (B) <10 <10 mg/dL    Comment: (NOTE) Lowest detectable limit for serum alcohol is 10 mg/dL.  For medical purposes only. Performed at [facility], [address] [place], [place] [address]   cbc     Status: Abnormal   Collection Time: 02/01/20  5:06 PM  Result Value Ref Range   WBC 16.3 (H) 4.0 - 10.5 K/uL   RBC 4.77 3.87 - 5.11 MIL/uL   Hemoglobin 15.1 (H) 12.0 - 15.0 g/dL   HCT 01.0 36 - 46 %   MCV 92.0 80.0 - 100.0 fL   MCH 31.7 26.0 - 34.0 pg   MCHC 34.4 30.0 - 36.0 g/dL   RDW 93.2 35.5 - 73.2 %   Platelets 324 150 - 400 K/uL   nRBC 0.0 0.0 - 0.2 %    Comment: Performed at [facility], [address] [place], [place] [address]  Urine Drug Screen, Qualitative     Status: Abnormal   Collection Time: 02/01/20  5:06 PM  Result Value Ref Range   Tricyclic, Ur Screen NONE DETECTED NONE DETECTED   Amphetamines, Ur Screen NONE DETECTED NONE DETECTED   MDMA (Ecstasy)Ur Screen NONE DETECTED NONE DETECTED   Cocaine Metabolite,Ur [place] NONE DETECTED NONE DETECTED   Opiate, Ur Screen NONE DETECTED NONE DETECTED   Phencyclidine (PCP) Ur S NONE DETECTED NONE DETECTED   Cannabinoid 50 Ng, Ur [place] POSITIVE (A) NONE DETECTED   Barbiturates, Ur Screen NONE DETECTED NONE DETECTED   Benzodiazepine, Ur Scrn NONE DETECTED NONE DETECTED    Methadone Scn, Ur NONE DETECTED NONE DETECTED    Comment: (NOTE) Tricyclics + metabolites, urine    Cutoff 1000 ng/mL Amphetamines + metabolites, urine  Cutoff 1000 ng/mL MDMA (Ecstasy), urine              Cutoff 500 ng/mL Cocaine Metabolite, urine          Cutoff 300 ng/mL Opiate + metabolites, urine        Cutoff 300 ng/mL Phencyclidine (PCP), urine         Cutoff 25 ng/mL Cannabinoid, urine                 Cutoff 50 ng/mL Barbiturates + metabolites, urine  Cutoff 200 ng/mL Benzodiazepine, urine              Cutoff 200 ng/mL Methadone, urine                   Cutoff 300 ng/mL  The urine drug screen provides only a preliminary, unconfirmed analytical test result and should not be used for non-medical purposes. Clinical consideration and professional judgment should be applied to any positive drug screen result due to possible interfering substances. A more specific alternate chemical method must be used in order to obtain a confirmed analytical result. Gas chromatography / mass spectrometry (GC/MS) is the preferred confirm atory method. Performed at [facility], [address] [place], [place] [address]   Pregnancy, urine     Status:  None   Collection Time: 02/01/20  5:06 PM  Result Value Ref Range   Preg Test, Ur NEGATIVE NEGATIVE    Comment: Performed at [facility], [address] [place], [place] [address]  Urinalysis, Complete w Microscopic     Status: Abnormal   Collection Time: 02/01/20  5:06 PM  Result Value Ref Range   Color, Urine STRAW (A) YELLOW   APPearance HAZY (A) CLEAR   Specific Gravity, Urine 1.002 (L) 1.005 - 1.030   pH 6.0 5.0 - 8.0   Glucose, UA NEGATIVE NEGATIVE mg/dL   Hgb urine dipstick SMALL (A) NEGATIVE   Bilirubin Urine NEGATIVE NEGATIVE   Ketones, ur NEGATIVE NEGATIVE mg/dL   Protein, ur NEGATIVE NEGATIVE mg/dL   Nitrite NEGATIVE NEGATIVE   Leukocytes,Ua NEGATIVE NEGATIVE   RBC / HPF 0-5 0 - 5 RBC/hpf   WBC, UA 0-5 0 - 5  WBC/hpf   Bacteria, UA NONE SEEN NONE SEEN   Squamous Epithelial / LPF 0-5 0 - 5    Comment: Performed at [facility], [address] [place], [place] [address]  Pregnancy, urine POC     Status: None   Collection Time: 02/01/20  5:06 PM  Result Value Ref Range   Preg Test, Ur NEGATIVE NEGATIVE    Comment:        THE SENSITIVITY OF THIS METHODOLOGY IS >24 mIU/mL   SARS Coronavirus 2 by RT PCR (hospital order, performed in [facility] hospital lab) Nasopharyngeal Nasopharyngeal Swab     Status: None   Collection Time: 02/01/20  7:55 PM   Specimen: Nasopharyngeal Swab  Result Value Ref Range   SARS Coronavirus 2 NEGATIVE NEGATIVE    Comment: (NOTE) SARS-CoV-2 target nucleic acids are NOT DETECTED.  The SARS-CoV-2 RNA is generally detectable in upper and lower respiratory specimens during the acute phase of infection. The lowest concentration of SARS-CoV-2 viral copies this assay can detect is 250 copies / mL. A negative result does not preclude SARS-CoV-2 infection and should not be used as the sole basis for treatment or other patient management decisions.  A negative result may occur with improper specimen collection / handling, submission of specimen other than nasopharyngeal swab, presence of viral mutation(s) within the areas targeted by this assay, and inadequate number of viral copies (<250 copies / mL). A negative result must be combined with clinical observations, patient history, and epidemiological information.  Fact Sheet for Patients:   BoilerBrush.com.cyhttps://www.fda.gov/media/136312/download  Fact Sheet for Healthcare Providers: https://pope.com/https://www.fda.gov/media/136313/download  This test is not yet approved or  cleared by the [place] FDA and has been authorized for detection and/or diagnosis of SARS-CoV-2 by FDA under an Emergency Use Authorization (EUA).  This EUA will remain in effect (meaning this test can be used) for the duration of the COVID-19 declaration under  Section 564(b)(1) of the Act, 21 U.S.C. section 360bbb-3(b)(1), unless the authorization is terminated or revoked sooner.  Performed at [facility], [address] [place], [place] [address]     Blood Alcohol level:  Lab Results  Component Value Date   [facility] <10 02/01/2020   ETH <10 01/20/2020    Metabolic Disorder Labs: No results found for: HGBA1C, MPG No results found for: PROLACTIN Lab Results  Component Value Date   CHOL 134 02/06/2019   TRIG 77 02/06/2019   HDL 39 (L) 02/06/2019   CHOLHDL 3.4 02/06/2019   VLDL 15 02/06/2019   LDLCALC 80 02/06/2019    Physical Findings: AIMS:  , ,  ,  ,  CIWA:    COWS:     Musculoskeletal: Strength & Muscle Tone: within normal limits Gait & Station: normal Patient leans: N/A  Psychiatric Specialty Exam: Physical Exam  Nursing note and vitals reviewed. Constitutional: She is oriented to person, place, and time.  HENT:  Head: Normocephalic and atraumatic.  Respiratory: Effort normal.  GI: Normal appearance.  Neurological: She is alert and oriented to person, place, and time.    Review of Systems  Blood pressure 103/77, pulse 86, temperature 98.7 F (37.1 C), temperature source Oral, resp. rate 18, height 5\' 3"  (1.6 m), weight 75.3 kg, SpO2 99 %.Body mass index is 29.41 kg/m.  General Appearance: Casual  Eye Contact:  Fair  Speech:  Normal Rate  Volume:  Normal  Mood:  Euthymic  Affect:  Congruent  Thought Process:  Coherent and Descriptions of Associations: Circumstantial  Orientation:  Full (Time, Place, and Person)  Thought Content:  Delusions and Paranoid Ideation  Suicidal Thoughts:  No  Homicidal Thoughts:  No  Memory:  Immediate;   Fair Recent;   Fair Remote;   Fair  Judgement:  Intact  Insight:  Fair  Psychomotor Activity:  Normal  Concentration:  Concentration: Fair and Attention Span: Fair  Recall:  of Knowledge:  Fair  Language:  Fair  Akathisia:  Negative  Handed:  Right   AIMS (if indicated):     Assets:  Desire for Improvement Resilience  ADL's:  Intact  Cognition:  WNL  Sleep:  Number of Hours: 8.5     Treatment Plan Summary: Daily contact with patient to assess and evaluate symptoms and progress in treatment, Medication management and Plan : Patient is seen and examined.  Patient is a 39 year old female with the above-stated past psychiatric history who is seen in follow-up.   Diagnosis: 1.  Psychosis with differential diagnosis including schizophrenia versus schizoaffective disorder versus substance-induced psychotic disorder.  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 2.  Cannabis use disorder 3.  Hypokalemia 4.  Leukocytosis 5.  Hyperglycemia  Findings on examination today: 1.  Decreased paranoia 2.  Improvement in mood 3.  Improvement in sleep 4.  Hyperglycemia 5.  Hypokalemia  Plan: 1.  Give potassium chloride 20 mEq p.o. x1 today.  This is for hypokalemia. 2.  Add hemoglobin A1c secondary to hyperglycemia. 3.  Repeat CBC given elevated white blood cell count on admission.  Patient remains afebrile. 4.  Continue citalopram 20 mg p.o. daily for anxiety and depression. 5.  Continue Zyprexa 10 mg p.o. nightly for psychosis. 6.  Continue Protonix 40 mg p.o. daily for gastric protection. 7.  Continue trazodone 50 mg p.o. nightly as needed insomnia. 8.  Disposition planning-in progress.  31, MD 02/03/2020, 11:40 AM
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
## 2020-02-03 NOTE — BHH Group Notes (Signed)
LCSW Relapse Prevention and Social Support Group Note   02/03/2020 1300  Type of Group and Topic: Psychoeducational Group:  Relapse prevention and social support.   Participation Level:  Active  Description of Group  Relapse prevention and developing social support group identifies mental health triggers and early warning signs as a first step towards developing appropriate coping skills.  This can include a relapse of mental health or substance use symptoms.  With the help of examples, patients are encouraged to recognize and intervene when symptoms return before a crisis occurs.  Patients are also engaged to evaluate their current support network and to consider ways to increase and broaden that network.    Therapeutic Goals 1. Patients will identify triggers and early symptoms related to both mental health and substance use relapses. 2. Patients will begin the process of identifying plans/coping skills to manage these symptoms before they escalate to a crisis. 3. Patients will consider individuals in their current support network, whether they are positive or negative supports, and look at options to increase the number of positive supports in their network.    Summary of Patient Progress: Pt active and appropriate in group.  Made a number of good comments and offered support to another group member as well. Good participation.      Therapeutic Modalities: Cognitive Behavioral Therapy Psychoeducation    Daleen Squibb, MSW, LCSW

## 2020-02-03 NOTE — Tx Team (Signed)
Interdisciplinary Treatment and Diagnostic Plan Update  02/03/2020 Time of Session: 0855 Cheryl Cook MRN: [id number]  Principal Diagnosis: Schizophrenia [facility])  Secondary Diagnoses: Principal Problem:   Schizophrenia ([place]) Active Problems:   Acute psychosis ([place])   Marijuana abuse   Current Medications:  Current Facility-Administered Medications  Medication Dose Route Frequency Provider Last Rate Last Admin  . acetaminophen (TYLENOL) tablet 650 mg  650 mg Oral Q6H PRN Caroline Sauger, NP      . alum & mag hydroxide-simeth (MAALOX/MYLANTA) 200-200-20 MG/5ML suspension 30 mL  30 mL Oral Q4H PRN Caroline Sauger, NP      . citalopram (CELEXA) tablet 20 mg  20 mg Oral Daily Clapacs, Cheryl Reno, MD   20 mg at 02/03/20 0803  . hydrocortisone cream 1 %   Topical BID Clapacs, Cheryl Reno, MD   Given at 02/03/20 9449  . magnesium hydroxide (MILK OF MAGNESIA) suspension 30 mL  30 mL Oral Daily PRN Caroline Sauger, NP      . nicotine (NICODERM CQ - dosed in mg/24 hours) patch 21 mg  21 mg Transdermal Daily Clapacs, Cheryl Reno, MD   21 mg at 02/03/20 0804  . OLANZapine (ZYPREXA) tablet 10 mg  10 mg Oral QHS Caroline Sauger, NP   10 mg at 02/02/20 2115  . pantoprazole (PROTONIX) EC tablet 40 mg  40 mg Oral Daily Clapacs, Cheryl Reno, MD   40 mg at 02/03/20 0803  . traZODone (DESYREL) tablet 50 mg  50 mg Oral QHS PRN Caroline Sauger, NP   50 mg at 02/02/20 2115   PTA Medications: Facility-Administered Medications Prior to Admission  Medication Dose Route Frequency Provider Last Rate Last Admin  . medroxyPROGESTERone (DEPO-PROVERA) injection 150 mg  150 mg Intramuscular Q90 days Cheryl Macadam, MD   150 mg at 11/14/19 1213   Medications Prior to Admission  Medication Sig Dispense Refill Last Dose  . Multiple Vitamin (MULTIVITAMIN WITH MINERALS) TABS tablet Take 1 tablet by mouth daily. (Patient not taking: Reported on 02/01/2020) 30 tablet 1   . OLANZapine (ZYPREXA) 10 MG tablet Take  1 tablet (10 mg total) by mouth at bedtime. (Patient not taking: Reported on 09/26/2019) 30 tablet 1     Patient Stressors: Educational concerns Financial difficulties  Patient Strengths: Average or above average intelligence Capable of independent living  Treatment Modalities: Medication Management, Group therapy, Case management,  1 to 1 session with clinician, Psychoeducation, Recreational therapy.   Physician Treatment Plan for Primary Diagnosis: Schizophrenia ([place]) Long Term Goal(s): Improvement in symptoms so as ready for discharge Improvement in symptoms so as ready for discharge   Short Term Goals: Ability to identify and develop effective coping behaviors will improve Compliance with prescribed medications will improve Ability to verbalize feelings will improve Ability to maintain clinical measurements within normal limits will improve  Medication Management: Evaluate patient's response, side effects, and tolerance of medication regimen.  Therapeutic Interventions: 1 to 1 sessions, Unit Group sessions and Medication administration.  Evaluation of Outcomes: Not Met  Physician Treatment Plan for Secondary Diagnosis: Principal Problem:   Schizophrenia ([place]) Active Problems:   Acute psychosis ([place])   Marijuana abuse  Long Term Goal(s): Improvement in symptoms so as ready for discharge Improvement in symptoms so as ready for discharge   Short Term Goals: Ability to identify and develop effective coping behaviors will improve Compliance with prescribed medications will improve Ability to verbalize feelings will improve Ability to maintain clinical measurements within normal limits will improve  Medication Management: Evaluate patient's response, side effects, and tolerance of medication regimen.  Therapeutic Interventions: 1 to 1 sessions, Unit Group sessions and Medication administration.  Evaluation of Outcomes: Not Met   RN Treatment Plan for Primary Diagnosis:  Schizophrenia ([place]) Long Term Goal(s): Knowledge of disease and therapeutic regimen to maintain health will improve  Short Term Goals: Ability to identify and develop effective coping behaviors will improve and Compliance with prescribed medications will improve  Medication Management: RN will administer medications as ordered by provider, will assess and evaluate patient's response and provide education to patient for prescribed medication. RN will report any adverse and/or side effects to prescribing provider.  Therapeutic Interventions: 1 on 1 counseling sessions, Psychoeducation, Medication administration, Evaluate responses to treatment, Monitor vital signs and CBGs as ordered, Perform/monitor CIWA, COWS, AIMS and Fall Risk screenings as ordered, Perform wound care treatments as ordered.  Evaluation of Outcomes: Not Met   LCSW Treatment Plan for Primary Diagnosis: Schizophrenia ([place]) Long Term Goal(s): Safe transition to appropriate next level of care at discharge, Engage patient in therapeutic group addressing interpersonal concerns.  Short Term Goals: Engage patient in aftercare planning with referrals and resources, Increase social support and Increase skills for wellness and recovery  Therapeutic Interventions: Assess for all discharge needs, 1 to 1 time with Social worker, Explore available resources and support systems, Assess for adequacy in community support network, Educate family and significant other(s) on suicide prevention, Complete Psychosocial Assessment, Interpersonal group therapy.  Evaluation of Outcomes: Not Met   Progress in Treatment: Attending groups: Yes. Participating in groups: Yes. Taking medication as prescribed: Yes. Toleration medication: Yes. Family/Significant other contact made: No, will contact:  mother Patient understands diagnosis: Yes. Discussing patient identified problems/goals with staff: Yes. Medical problems stabilized or resolved:  Yes. Denies suicidal/homicidal ideation: Yes. Issues/concerns per patient self-inventory: No. Other: none  New problem(s) identified: No, Describe:  none  New Short Term/Long Term Goal(s):  Patient Goals:  "continue to get better" Discharge Plan or Barriers:   Reason for Continuation of Hospitalization: Delusions  Medication stabilization  Estimated Length of Stay: 2-4 days.  Attendees: Patient:Cheryl Cook 02/03/2020   Physician: Dr. Mallie Darting, MD 02/03/2020   Nursing: Al Corpus, RN 02/03/2020   RN Care Manager: 02/03/2020   Social Worker: Lurline Idol, LCSW 02/03/2020   Recreational Therapist:  02/03/2020   Other:  02/03/2020   Other:  02/03/2020  Other: 02/03/2020        Scribe for Treatment Team: Joanne Chars, [place] 02/03/2020 11:07 AM

## 2020-02-03 NOTE — Plan of Care (Signed)
D: Pt alert and oriented x 4.  Pt denies experiencing any pain at this time. Pt denies experiencing any SI/HI, or AVH at this time.   A: Scheduled medications administered to pt, per MD orders. Support and encouragement provided. Frequent verbal contact made. Routine safety checks conducted q15 minutes.   R: No adverse drug reactions noted. Pt verbally contracts for safety at this time. Pt complaint with medications and treatment plan. Pt interacts well with others on the unit. Pt remains safe at this time. Will continue to monitor.  Problem: Education: Goal: Knowledge of [place] [facility] Education information/materials will improve Outcome: Progressing Goal: Emotional status will improve Outcome: Progressing Goal: Mental status will improve Outcome: Progressing Goal: Verbalization of understanding the information provided will improve Outcome: Progressing

## 2020-02-03 NOTE — [facility] Group Notes (Signed)
[facility] Group Notes:  (Nursing/MHT/Case Management/Adjunct)  Date:  02/03/2020  Time:  9:13 PM  Type of Therapy:  Group Therapy  Participation Level:  Active  Participation Quality:  Appropriate  Affect:  Appropriate  Cognitive:  Alert  Insight:  Good  Engagement in Group:  Engaged and her goal is being social active and one of her coping skills is praying.  Modes of Intervention:  Support  Summary of Progress/Problems:  Mayra Neer 02/03/2020, 9:13 PM

## 2020-02-03 NOTE — Progress Notes (Signed)
Patient alert and oriented x 4, affect is blunted thoughts are organized and coherent, patient interacting appropriately with peers and staff, her thoughts are organized and coherent she currently denies SI/HI/AVH. 15 minutes safety checks maintained will continue to monitor.

## 2020-02-04 MED ORDER — DIPHENHYDRAMINE HCL 25 MG PO CAPS
25.0000 mg | ORAL_CAPSULE | ORAL | Status: DC | PRN
  Administered 2020-02-04 (×2): 25 mg via ORAL
  Filled 2020-02-04 (×2): qty 1

## 2020-02-04 NOTE — Progress Notes (Signed)
Patient stated to this writer that she thinks she is having a reaction to her Nicotine patch. She is complaining of itching at the area where the patch was placed. MD will be notified.

## 2020-02-04 NOTE — Progress Notes (Signed)
[facility] MD Progress Note  02/04/2020 10:06 AM Cheryl Cook  MRN:  [id number] Subjective:  Patient is a 39 year old female who is brought to the emergency department at the [facility] on 02/02/2020 under involuntary commitment.  The patient was paranoid, and thinking that she was pregnant.  She also felt like people were trying to break into her house.  Objective: Patient is seen and examined.  Patient is a 39 year old female with the above-stated past psychiatric history who is seen in follow-up.  Her only complaint today is she has got a rash where the nicotine patch was.  We have stopped that, and I have added Benadryl for as needed itching.  She already has available hydrocortisone cream.  She denied any auditory or visual hallucinations.  She denied any paranoid symptoms.  She denied any suicidal or homicidal ideation.  She denied any side effects to her current medications.  Her vital signs are stable, she is afebrile.  She slept 6.75 hours last night.  Repeat laboratories yesterday revealed a decrease in her white blood cell count from 16,000-13,000.  Differential was normal.  Her hemoglobin A1c came back at 6.0.  TSH was 1.596.  Principal Problem: Schizophrenia ([name]) Diagnosis: Principal Problem:   Schizophrenia ([name]) Active Problems:   Acute psychosis ([name])   Marijuana abuse  Total Time spent with patient: 15 minutes  Past Psychiatric History: See admission H&P  Past Medical History:  Past Medical History:  Diagnosis Date  . Schizophrenia ([name])    History reviewed. No pertinent surgical history. Family History:  Family History  Problem Relation Age of Onset  . Arthritis Mother   . Asthma Daughter   . Asthma Son   . Diabetes Maternal Grandmother   . Hypertension Maternal Grandmother   . Glaucoma Maternal Grandmother   . Diabetes Maternal Grandfather   . Hypertension Maternal Grandfather   . Glaucoma Maternal Grandfather   . Aneurysm Maternal Grandfather   .  Congestive Heart Failure Maternal Grandfather   . Asthma Half-Sister    Family Psychiatric  History: See admission H&P Social History:  Social History   Substance and Sexual Activity  Alcohol Use No     Social History   Substance and Sexual Activity  Drug Use No    Social History   Socioeconomic History  . Marital status: Single    Spouse name: Not on file  . Number of children: Not on file  . Years of education: Not on file  . Highest education level: Not on file  Occupational History  . Not on file  Tobacco Use  . Smoking status: Current Every Day Smoker    Packs/day: 0.50    Types: Cigarettes  . Smokeless tobacco: Never Used  Vaping Use  . Vaping Use: Never used  Substance and Sexual Activity  . Alcohol use: No  . Drug use: No  . Sexual activity: Yes    Birth control/protection: None  Other Topics Concern  . Not on file  Social History Narrative  . Not on file   Social Determinants of Health   Financial Resource Strain:   . Difficulty of Paying Living Expenses:   Food Insecurity:   . Worried About Programme researcher, broadcasting/film/video in the Last Year:   . Barista in the Last Year:   Transportation Needs:   . Freight forwarder (Medical):   Marland Kitchen Lack of Transportation (Non-Medical):   Physical Activity:   . Days of Exercise per Week:   .  Minutes of Exercise per Session:   Stress:   . Feeling of Stress :   Social Connections:   . Frequency of Communication with Friends and Family:   . Frequency of Social Gatherings with Friends and Family:   . Attends Religious Services:   . Active Member of Clubs or Organizations:   . Attends Banker Meetings:   Marland Kitchen Marital Status:    Additional Social History:                         Sleep: Good  Appetite:  Good  Current Medications: Current Facility-Administered Medications  Medication Dose Route Frequency Provider Last Rate Last Admin  . acetaminophen (TYLENOL) tablet 650 mg  650 mg Oral Q6H  PRN Gillermo Murdoch, NP   650 mg at 02/03/20 1639  . alum & mag hydroxide-simeth (MAALOX/MYLANTA) 200-200-20 MG/5ML suspension 30 mL  30 mL Oral Q4H PRN Gillermo Murdoch, NP      . citalopram (CELEXA) tablet 20 mg  20 mg Oral Daily Clapacs, Jackquline Denmark, MD   20 mg at 02/04/20 0912  . diphenhydrAMINE (BENADRYL) capsule 25 mg  25 mg Oral Q4H PRN Antonieta Pert, MD      . hydrocortisone cream 1 %   Topical BID Clapacs, Jackquline Denmark, MD   Given at 02/04/20 0913  . magnesium hydroxide (MILK OF MAGNESIA) suspension 30 mL  30 mL Oral Daily PRN Gillermo Murdoch, NP      . OLANZapine (ZYPREXA) tablet 10 mg  10 mg Oral QHS Gillermo Murdoch, NP   10 mg at 02/03/20 2107  . pantoprazole (PROTONIX) EC tablet 40 mg  40 mg Oral Daily Clapacs, Jackquline Denmark, MD   40 mg at 02/04/20 0912  . traZODone (DESYREL) tablet 50 mg  50 mg Oral QHS PRN Gillermo Murdoch, NP   50 mg at 02/03/20 2108    Lab Results:  Results for orders placed or performed during the hospital encounter of 02/02/20 (from the past 48 hour(s))  Hemoglobin A1c     Status: Abnormal   Collection Time: 02/03/20  7:16 PM  Result Value Ref Range   Hgb A1c MFr Bld 6.0 (H) 4.8 - 5.6 %    Comment: (NOTE) Pre diabetes:          5.7%-6.4%  Diabetes:              >6.4%  Glycemic control for   <7.0% adults with diabetes    Mean Plasma Glucose 125.5 mg/dL    Comment: Performed at [facility], [address], [place], [place] [address]  CBC with Differential/Platelet     Status: Abnormal   Collection Time: 02/03/20  7:16 PM  Result Value Ref Range   WBC 13.0 (H) 4.0 - 10.5 K/uL   RBC 4.40 3.87 - 5.11 MIL/uL   Hemoglobin 13.8 12.0 - 15.0 g/dL   HCT 78.4 36 - 46 %   MCV 94.1 80.0 - 100.0 fL   MCH 31.4 26.0 - 34.0 pg   MCHC 33.3 30.0 - 36.0 g/dL   RDW 69.6 29.5 - 28.4 %   Platelets 321 150 - 400 K/uL   nRBC 0.0 0.0 - 0.2 %   Neutrophils Relative % 62 %   Neutro Abs 8.1 (H) 1.7 - 7.7 K/uL   Lymphocytes Relative 26 %   Lymphs Abs  3.4 0.7 - 4.0 K/uL   Monocytes Relative 9 %   Monocytes Absolute 1.1 (H) 0 -  1 K/uL   Eosinophils Relative 2 %   Eosinophils Absolute 0.3 0 - 0 K/uL   Basophils Relative 1 %   Basophils Absolute 0.1 0 - 0 K/uL   Immature Granulocytes 0 %   Abs Immature Granulocytes 0.03 0.00 - 0.07 K/uL    Comment: Performed at [facility], [address] [place], [place] [address]  TSH     Status: None   Collection Time: 02/03/20  7:16 PM  Result Value Ref Range   TSH 1.596 0.350 - 4.500 uIU/mL    Comment: Performed by a 3rd Generation assay with a functional sensitivity of <=0.01 uIU/mL. Performed at [facility], [address] [place], [place] [address]     Blood Alcohol level:  Lab Results  Component Value Date   [facility] <10 02/01/2020   ETH <10 01/20/2020    Metabolic Disorder Labs: Lab Results  Component Value Date   HGBA1C 6.0 (H) 02/03/2020   MPG 125.5 02/03/2020   No results found for: PROLACTIN Lab Results  Component Value Date   CHOL 134 02/06/2019   TRIG 77 02/06/2019   HDL 39 (L) 02/06/2019   CHOLHDL 3.4 02/06/2019   VLDL 15 02/06/2019   LDLCALC 80 02/06/2019    Physical Findings: AIMS:  , ,  ,  ,    CIWA:    COWS:     Musculoskeletal: Strength & Muscle Tone: within normal limits Gait & Station: normal Patient leans: N/A  Psychiatric Specialty Exam: Physical Exam  Nursing note and vitals reviewed. Constitutional: She is oriented to person, place, and time.  HENT:  Head: Normocephalic and atraumatic.  Respiratory: Effort normal.  GI: Normal appearance.  Neurological: She is alert and oriented to person, place, and time.  Psychiatric: Her behavior is normal. Mood and thought content normal.    Review of Systems  Blood pressure 115/85, pulse 93, temperature 98.4 F (36.9 C), temperature source Oral, resp. rate 18, height 5\' 3"  (1.6 m), weight 75.3 kg, SpO2 100 %.Body mass index is 29.41 kg/m.  General Appearance: Casual  Eye Contact:   Fair  Speech:  Normal Rate  Volume:  Normal  Mood:  Euthymic  Affect:  Congruent  Thought Process:  Coherent and Descriptions of Associations: Intact  Orientation:  Full (Time, Place, and Person)  Thought Content:  Logical  Suicidal Thoughts:  No  Homicidal Thoughts:  No  Memory:  Immediate;   Fair Recent;   Fair Remote;   Fair  Judgement:  Intact  Insight:  Fair  Psychomotor Activity:  Normal  Concentration:  Concentration: Fair and Attention Span: Fair  Recall:  of Knowledge:  Fair  Language:  Good  Akathisia:  Negative  Handed:  Right  AIMS (if indicated):     Assets:  Desire for Improvement Resilience  ADL's:  Intact  Cognition:  WNL  Sleep:  Number of Hours: 6.75     Treatment Plan Summary: Daily contact with patient to assess and evaluate symptoms and progress in treatment, Medication management and Plan : Patient is seen and examined.  Patient is a 39 year old female with the above-stated past psychiatric history who is seen in follow-up.   Diagnosis: 1.  Psychosis with differential diagnosis including schizophrenia versus schizoaffective disorder versus substance-induced psychotic disorder.  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 2.  Cannabis use disorder 3.  Hypokalemia 4.  Leukocytosis 5.  Hyperglycemia  Pertinent findings on examination today: 1.  Decreased paranoia 2.  Improvement in mood 3.  Improvement in sleep 4.  Confirmation of diet-controlled diabetes mellitus with a hemoglobin A1c of 6.0. 5.  Decrease in white blood cell count from 16,000-14,000. 6.  Resolution of hypokalemia 7.  Rash with nicotine patch  Plan: 1.  Continue citalopram 20 mg p.o. daily for anxiety and depression. 2.  Continue Zyprexa 10 mg p.o. nightly for psychosis and sleep. 3.  Continue Protonix 40 mg p.o. daily for gastric protection. 4.  Continue trazodone 50 mg p.o. nightly as needed insomnia. 5.  Add Benadryl 25 mg p.o.  every 4 hours as needed itching or rash symptoms. 6.  Patient with confirmation of diabetes mellitus type 2 with a hemoglobin A1c of 6.0 that is diet controlled. 7.  Disposition planning-in progress.  Sharma Covert, MD 02/04/2020, 10:06 AM

## 2020-02-04 NOTE — [facility] Group Notes (Signed)
LCSW Group Therapy Note  02/04/2020   1:00-1:35 pm    Type of Therapy and Topic:  Group Therapy: Anger Cues and Responses  Participation Level:  Active   Description of Group:   In this group, patients learned how to recognize the physical, cognitive, emotional, and behavioral responses they have to anger-provoking situations.  They identified a recent time they became angry and how they reacted.  They analyzed how their reaction was possibly beneficial and how it was possibly unhelpful.  The group discussed a variety of healthier coping skills that could help with such a situation in the future.  Focus was placed on how helpful it is to recognize the underlying emotions to our anger, because working on those can lead to a more permanent solution as well as our ability to focus on the important rather than the urgent.  Therapeutic Goals: 1. Patients will remember their last incident of anger and how they felt emotionally and physically, what their thoughts were at the time, and how they behaved. 2. Patients will identify how their behavior at that time worked for them, as well as how it worked against them. 3. Patients will explore possible new behaviors to use in future anger situations. 4. Patients will learn that anger itself is normal and cannot be eliminated, and that healthier reactions can assist with resolving conflict rather than worsening situations.  Summary of Patient Progress:  The patient shared that her anger triggers are liars. Patient shared what helps her cope is when she gets in "my zone" and focuses on things in her head. Pt's remarks assisted in starting a discussion around the power of positive thinking.   Therapeutic Modalities:   Cognitive Behavioral Therapy  Shellia Cleverly

## 2020-02-04 NOTE — Plan of Care (Signed)
  Problem: Education: Goal: Knowledge of [place] [facility] Education information/materials will improve Outcome: Progressing Goal: Emotional status will improve Outcome: Progressing Goal: Mental status will improve Outcome: Progressing Goal: Verbalization of understanding the information provided will improve Outcome: Progressing   

## 2020-02-04 NOTE — Progress Notes (Signed)
Patient is quiet and reserved. Denied SI/HI/AVH depression and anxiety at this encounter.   Patient also has no pain to report. She received her prescribed meds and tolerated without incident. She remains safe wit5h 15 minute rounds and informed to contact staff if the need arises.

## 2020-02-04 NOTE — Plan of Care (Signed)
D- Patient alert and oriented. Affect/mood. Patient denies SI, HI, AVH, and pain at this time. Patient also denies any signs/symptoms of depression/anxiety, reporting that "every since I started taking my meds again, everything's started calming back down". Patient's goal for today is to "keep on doing good", in which "staying around people", is what she's going to do in order to achieve her goal.  A- Scheduled medications administered to patient, per MD orders. Support and encouragement provided.  Routine safety checks conducted every 15 minutes.  Patient informed to notify staff with problems or concerns.  R- No adverse drug reactions noted. Patient contracts for safety at this time. Patient compliant with medications and treatment plan. Patient receptive, calm, and cooperative. Patient interacts well with others on the unit.  Patient remains safe at this time.  Problem: Education: Goal: Knowledge of [place] [facility] Education information/materials will improve Outcome: Progressing Goal: Emotional status will improve Outcome: Progressing Goal: Mental status will improve Outcome: Progressing Goal: Verbalization of understanding the information provided will improve Outcome: Progressing

## 2020-02-05 NOTE — Progress Notes (Signed)
Patient is active on the unit, with pleasant upbeat attitude. She is social on the unit and interacts well with other peers on the unit.  Patient denies SI/HI/AVH depression and anxiety.  She accepted ad received her medication and tolerated them without incident. Reports rash on her arm has cleared. She remains safe on the unit with 15 minute safety checks.

## 2020-02-05 NOTE — Plan of Care (Signed)
  Problem: Education: Goal: Knowledge of [place] [facility] Education information/materials will improve Outcome: Progressing Goal: Emotional status will improve Outcome: Progressing Goal: Mental status will improve Outcome: Progressing Goal: Verbalization of understanding the information provided will improve Outcome: Progressing   

## 2020-02-05 NOTE — Progress Notes (Signed)
Recreation Therapy Notes  Date: 02/05/2020  Time: 9:30 am  Location: Outside   Behavioral response: Appropriate  Intervention Topic: Relaxation    Discussion/Intervention:  Group content today was focused on relaxation. The group defined relaxation and identified healthy ways to relax. Individuals expressed how much time they spend relaxing. Patients expressed how much their life would be if they did not make time for themselves to relax. The group stated ways they could improve their relaxation techniques in the future.  Individuals participated in the intervention "Time to Relax" where they had a chance to experience different relaxation techniques.  Clinical Observations/Feedback:  Patient came to group and defined relaxation as sitting outside. She stated that she plays with her kids to relax. Participant explained that it is important to relax the body. Individual was social with peers and staff while participating on the intervention.  Jerel Sardina LRT/CTRS         Selma Mink 02/05/2020 11:45 AM

## 2020-02-05 NOTE — Progress Notes (Signed)
[employer] [employer] MD Progress Note  02/05/2020 11:56 AM Cheryl Cook  MRN:  [id number] Subjective: Follow-up for this patient with schizophrenia.  Patient reports she is feeling better.  Less nervous.  Thoughts more organized and lucid.  Denies suicidal or homicidal thought.  Denies hallucinations.  Has been tolerating medicine well.  No new physical complaints. Principal Problem: Schizophrenia ([name]) Diagnosis: Principal Problem:   Schizophrenia ([name]) Active Problems:   Acute psychosis ([name])   Marijuana abuse  Total Time spent with patient: 30 minutes  Past Psychiatric History: Past history of schizophrenia or schizoaffective disorder.  Past Medical History:  Past Medical History:  Diagnosis Date  . Schizophrenia ([name])    History reviewed. No pertinent surgical history. Family History:  Family History  Problem Relation Age of Onset  . Arthritis Mother   . Asthma Daughter   . Asthma Son   . Diabetes Maternal Grandmother   . Hypertension Maternal Grandmother   . Glaucoma Maternal Grandmother   . Diabetes Maternal Grandfather   . Hypertension Maternal Grandfather   . Glaucoma Maternal Grandfather   . Aneurysm Maternal Grandfather   . Congestive Heart Failure Maternal Grandfather   . Asthma Half-Sister    Family Psychiatric  History: See previous Social History:  Social History   Substance and Sexual Activity  Alcohol Use No     Social History   Substance and Sexual Activity  Drug Use No    Social History   Socioeconomic History  . Marital status: Single    Spouse name: Not on file  . Number of children: Not on file  . Years of education: Not on file  . Highest education level: Not on file  Occupational History  . Not on file  Tobacco Use  . Smoking status: Current Every Day Smoker    Packs/day: 0.50    Types: Cigarettes  . Smokeless tobacco: Never Used  Vaping Use  . Vaping Use: Never used  Substance and Sexual Activity  . Alcohol use: No  . Drug use: No  . Sexual  activity: Yes    Birth control/protection: None  Other Topics Concern  . Not on file  Social History Narrative  . Not on file   Social Determinants of Health   Financial Resource Strain:   . Difficulty of Paying Living Expenses:   Food Insecurity:   . Worried About Programme researcher, broadcasting/film/video in the Last Year:   . Barista in the Last Year:   Transportation Needs:   . Freight forwarder (Medical):   Marland Kitchen Lack of Transportation (Non-Medical):   Physical Activity:   . Days of Exercise per Week:   . Minutes of Exercise per Session:   Stress:   . Feeling of Stress :   Social Connections:   . Frequency of Communication with Friends and Family:   . Frequency of Social Gatherings with Friends and Family:   . Attends Religious Services:   . Active Member of Clubs or Organizations:   . Attends Banker Meetings:   Marland Kitchen Marital Status:    Additional Social History:                         Sleep: Fair  Appetite:  Fair  Current Medications: Current Facility-Administered Medications  Medication Dose Route Frequency Provider Last Rate Last Admin  . acetaminophen (TYLENOL) tablet 650 mg  650 mg Oral Q6H PRN Gillermo Murdoch, NP   650 mg at 02/03/20  1639  . alum & mag hydroxide-simeth (MAALOX/MYLANTA) 200-200-20 MG/5ML suspension 30 mL  30 mL Oral Q4H PRN Caroline Sauger, NP      . citalopram (CELEXA) tablet 20 mg  20 mg Oral Daily Alexsis Kathman, [name], MD   20 mg at 02/05/20 0807  . diphenhydrAMINE (BENADRYL) capsule 25 mg  25 mg Oral Q4H PRN Sharma Covert, MD   25 mg at 02/04/20 2104  . hydrocortisone cream 1 %   Topical BID Taksh Hjort, [name], MD   Given at 02/04/20 0913  . magnesium hydroxide (MILK OF MAGNESIA) suspension 30 mL  30 mL Oral Daily PRN Caroline Sauger, NP      . OLANZapine (ZYPREXA) tablet 10 mg  10 mg Oral QHS Caroline Sauger, NP   10 mg at 02/04/20 2104  . pantoprazole (PROTONIX) EC tablet 40 mg  40 mg Oral Daily Aarsh Fristoe, [name],  MD   40 mg at 02/05/20 0807  . traZODone (DESYREL) tablet 50 mg  50 mg Oral QHS PRN Caroline Sauger, NP   50 mg at 02/04/20 2103    Lab Results:  Results for orders placed or performed during the hospital encounter of 02/02/20 (from the past 48 hour(s))  Hemoglobin A1c     Status: Abnormal   Collection Time: 02/03/20  7:16 PM  Result Value Ref Range   Hgb A1c MFr Bld 6.0 (H) 4.8 - 5.6 %    Comment: (NOTE) Pre diabetes:          5.7%-6.4%  Diabetes:              >6.4%  Glycemic control for   <7.0% adults with diabetes    Mean Plasma Glucose 125.5 mg/dL    Comment: Performed at [facility], [place] [address] [place], [place] [address]  CBC with Differential/Platelet     Status: Abnormal   Collection Time: 02/03/20  7:16 PM  Result Value Ref Range   WBC 13.0 (H) 4.0 - 10.5 K/uL   RBC 4.40 3.87 - 5.11 MIL/uL   Hemoglobin 13.8 12.0 - 15.0 g/dL   HCT 41.4 36 - 46 %   MCV 94.1 80.0 - 100.0 fL   MCH 31.4 26.0 - 34.0 pg   MCHC 33.3 30.0 - 36.0 g/dL   RDW 13.5 11.5 - 15.5 %   Platelets 321 150 - 400 K/uL   nRBC 0.0 0.0 - 0.2 %   Neutrophils Relative % 62 %   Neutro Abs 8.1 (H) 1.7 - 7.7 K/uL   Lymphocytes Relative 26 %   Lymphs Abs 3.4 0.7 - 4.0 K/uL   Monocytes Relative 9 %   Monocytes Absolute 1.1 (H) 0 - 1 K/uL   Eosinophils Relative 2 %   Eosinophils Absolute 0.3 0 - 0 K/uL   Basophils Relative 1 %   Basophils Absolute 0.1 0 - 0 K/uL   Immature Granulocytes 0 %   Abs Immature Granulocytes 0.03 0.00 - 0.07 K/uL    Comment: Performed at [facility], [address] [place], [place] [address]  TSH     Status: None   Collection Time: 02/03/20  7:16 PM  Result Value Ref Range   TSH 1.596 0.350 - 4.500 uIU/mL    Comment: Performed by a 3rd Generation assay with a functional sensitivity of <=0.01 uIU/mL. Performed at [facility], [address], [place],  [address]     Blood Alcohol level:  Lab Results  Component Value Date   ETH <  10  02/01/2020   ETH <10 01/20/2020    Metabolic Disorder Labs: Lab Results  Component Value Date   HGBA1C 6.0 (H) 02/03/2020   MPG 125.5 02/03/2020   No results found for: PROLACTIN Lab Results  Component Value Date   CHOL 134 02/06/2019   TRIG 77 02/06/2019   HDL 39 (L) 02/06/2019   CHOLHDL 3.4 02/06/2019   VLDL 15 02/06/2019   LDLCALC 80 02/06/2019    Physical Findings: AIMS:  , ,  ,  ,    CIWA:    COWS:     Musculoskeletal: Strength & Muscle Tone: within normal limits Gait & Station: normal Patient leans: N/A  Psychiatric Specialty Exam: Physical Exam  Nursing note and vitals reviewed. Constitutional: She appears well-developed.  HENT:  Head: Normocephalic and atraumatic.  Eyes: Pupils are equal, round, and reactive to light. Conjunctivae are normal.  Cardiovascular: Normal heart sounds.  Respiratory: Effort normal.  GI: Soft.  Musculoskeletal:        General: Normal range of motion.     Cervical back: Normal range of motion.  Neurological: She is alert.  Skin: Skin is warm and dry.  Psychiatric: Her behavior is normal. Mood normal.    Review of Systems  Constitutional: Negative.   HENT: Negative.   Eyes: Negative.   Respiratory: Negative.   Cardiovascular: Negative.   Gastrointestinal: Negative.   Musculoskeletal: Negative.   Skin: Negative.   Neurological: Negative.   Psychiatric/Behavioral: Negative.     Blood pressure 126/80, pulse (!) 110, temperature 98.1 F (36.7 C), temperature source Oral, resp. rate 18, height 5\' 3"  (1.6 m), weight 75.3 kg, SpO2 99 %.Body mass index is 29.41 kg/m.  General Appearance: Casual  Eye Contact:  Good  Speech:  Clear and Coherent  Volume:  Normal  Mood:  Euthymic  Affect:  Congruent  Thought Process:  Goal Directed  Orientation:  Full (Time, Place, and Person)  Thought Content:  Logical  Suicidal Thoughts:  No  Homicidal Thoughts:  No  Memory:  Immediate;   Fair Recent;   Fair Remote;   Fair  Judgement:   Fair  Insight:  Fair  Psychomotor Activity:  Normal  Concentration:  Concentration: Fair  Recall:  of Knowledge:  Fair  Language:  Fair  Akathisia:  No  Handed:  Right  AIMS (if indicated):     Assets:  Desire for Improvement  ADL's:  Intact  Cognition:  WNL  Sleep:  Number of Hours: 6.75     Treatment Plan Summary: Daily contact with patient to assess and evaluate symptoms and progress in treatment, Medication management and Plan Doing much better on current medicine combination.  Showing better insight.  No acute physical problems.  Supportive counseling therapy and psychoeducation done.  Reviewed medicine.  Likely discharge tomorrow.  [employer], MD 02/05/2020, 11:56 AM

## 2020-02-05 NOTE — Plan of Care (Signed)
D- Patient alert and oriented. Patient presents in a pleasant mood on assessment stating that she slept "pretty good" last night and had no major complaints to voice to this Clinical research associate. Patient denies any signs/symptoms of depression/anxiety stating "the medicine I'm taking is helping with that". Patient also  denies SI, HI, AVH, and pain at this time. Patient had no stated goals for today.  A- Scheduled medications administered to patient, per MD orders. Support and encouragement provided.  Routine safety checks conducted every 15 minutes.  Patient informed to notify staff with problems or concerns.  R- No adverse drug reactions noted. Patient contracts for safety at this time. Patient compliant with medications and treatment plan. Patient receptive, calm, and cooperative. Patient interacts well with others on the unit.  Patient remains safe at this time.  Problem: Education: Goal: Knowledge of  General Education information/materials will improve Outcome: Progressing Goal: Emotional status will improve Outcome: Progressing Goal: Mental status will improve Outcome: Progressing Goal: Verbalization of understanding the information provided will improve Outcome: Progressing

## 2020-02-05 NOTE — Plan of Care (Signed)
  Problem: Education: Goal: Knowledge of [facility] Education information/materials will improve Outcome: Progressing Goal: Emotional status will improve Outcome: Progressing Goal: Mental status will improve Outcome: Progressing Goal: Verbalization of understanding the information provided will improve Outcome: Progressing   

## 2020-02-05 NOTE — [facility] Suicide Risk Assessment (Signed)
[facility] INPATIENT:  Family/Significant Other Suicide Prevention Education  Suicide Prevention Education:  Education Completed; [name], mother,  [phone],  has been identified by the patient as the family member/significant other with whom the patient will be residing, and identified as the person(s) who will aid the patient in the event of a mental health crisis (suicidal ideations/suicide attempt).  With written consent from the patient, the family member/significant other has been provided the following suicide prevention education, prior to the and/or following the discharge of the patient.  The suicide prevention education provided includes the following:  Suicide risk factors  Suicide prevention and interventions  National Suicide Hotline telephone number  [facility] assessment telephone number  [facility] Emergency Assistance 911  [facility] and/or Residential Mobile Crisis Unit telephone number  Request made of family/significant other to:  Remove weapons (e.g., guns, rifles, knives), all items previously/currently identified as safety concern.    Remove drugs/medications (over-the-counter, prescriptions, illicit drugs), all items previously/currently identified as a safety concern.  The family member/significant other verbalizes understanding of the suicide prevention education information provided.  The family member/significant other agrees to remove the items of safety concern listed above.  Mother reports that the patient "needs to be reminded" that she has to take her medications. Mother reports that patient had begun to lose touch with reality.  She reports that patient is not a danger to self or others any longer.  She reports that patient doesn't have access to any weapons.      Harden Mo 02/05/2020, 11:46 AM

## 2020-02-05 NOTE — Progress Notes (Signed)
Patient denies SI/HI/AVH depression and anxiety at this encounter. She received her medications and tolerated without incident. Patient shows arm where her nicotine patch was previously located and states that Benadryl has been helping to clear it up. Patient informed to contact staff if the condition changes and starts to worsen. Patient is safe with 15 minute safety checks and informed to contact staff with any concerns.

## 2020-02-06 MED ORDER — CITALOPRAM HYDROBROMIDE 20 MG PO TABS: 20.0000 mg | ORAL_TABLET | Freq: Every day | ORAL | 1 refills | Status: DC

## 2020-02-06 MED ORDER — TRAZODONE HCL 50 MG PO TABS: 50.0000 mg | ORAL_TABLET | Freq: Every evening | ORAL | 1 refills | Status: DC | PRN

## 2020-02-06 MED ORDER — TRAZODONE HCL 50 MG PO TABS: 50.0000 mg | ORAL_TABLET | Freq: Every evening | ORAL | 0 refills | Status: DC | PRN

## 2020-02-06 MED ORDER — PANTOPRAZOLE SODIUM 40 MG PO TBEC: 40.0000 mg | DELAYED_RELEASE_TABLET | Freq: Every day | ORAL | 0 refills | Status: DC

## 2020-02-06 MED ORDER — OLANZAPINE 10 MG PO TABS: 10.0000 mg | ORAL_TABLET | Freq: Every day | ORAL | 1 refills | Status: DC

## 2020-02-06 MED ORDER — PANTOPRAZOLE SODIUM 40 MG PO TBEC: 40.0000 mg | DELAYED_RELEASE_TABLET | Freq: Every day | ORAL | 1 refills | Status: DC

## 2020-02-06 MED ORDER — CITALOPRAM HYDROBROMIDE 20 MG PO TABS: 20.0000 mg | ORAL_TABLET | Freq: Every day | ORAL | 0 refills | Status: DC

## 2020-02-06 MED ORDER — OLANZAPINE 10 MG PO TABS: 10.0000 mg | ORAL_TABLET | Freq: Every day | ORAL | 0 refills | Status: DC

## 2020-02-06 NOTE — Progress Notes (Signed)
Care of patient taken over at 2300 and she seemed to sleep well with no issues to report on shift at this time. 

## 2020-02-06 NOTE — Progress Notes (Signed)
Recreation Therapy Notes  Date: 02/06/2020  Time: 9:30 am  Location: Outside   Behavioral response: Appropriate  Intervention Topic: Strengths   Discussion/Intervention:  Group content today was focused on strengths. The group identified some of the strengths they have. Individuals stated reason why they do not use their strengths. Patients expressed what strengths others see in them. The group identified important reason to use their strengths. The group participated in the intervention "Picking strengths", where they had a chance to identify some of their strengths. Clinical Observations/Feedback:  Patient came to group and expressed her strength she is trying to work on is understanding her anxiety and  The  Importance of taking her medication. Individual was social with peers and staff while participating on the intervention.  Kypton Eltringham LRT/CTRS           Mitzi Lilja 02/06/2020 11:22 AM

## 2020-02-06 NOTE — [facility] Suicide Risk Assessment (Signed)
[facility] Discharge Suicide Risk Assessment   Principal Problem: Schizophrenia [facility]) Discharge Diagnoses: Principal Problem:   Schizophrenia ([name]) Active Problems:   Acute psychosis ([name])   Marijuana abuse   Total Time spent with patient: 30 minutes  Musculoskeletal: Strength & Muscle Tone: within normal limits Gait & Station: normal Patient leans: N/A  Psychiatric Specialty Exam: Review of Systems  Constitutional: Negative.   HENT: Negative.   Eyes: Negative.   Respiratory: Negative.   Cardiovascular: Negative.   Gastrointestinal: Negative.   Musculoskeletal: Negative.   Skin: Negative.   Neurological: Negative.   Psychiatric/Behavioral: Negative.     Blood pressure 114/88, pulse 77, temperature 98.2 F (36.8 C), temperature source Oral, resp. rate 17, height 5\' 3"  (1.6 m), weight 75.3 kg, SpO2 100 %.Body mass index is 29.41 kg/m.  General Appearance: Casual  Eye Contact::  Good  Speech:  Clear and Coherent409  Volume:  Normal  Mood:  Euthymic  Affect:  Congruent  Thought Process:  Goal Directed  Orientation:  Full (Time, Place, and Person)  Thought Content:  Logical  Suicidal Thoughts:  No  Homicidal Thoughts:  No  Memory:  Immediate;   Fair Recent;   Fair Remote;   Fair  Judgement:  Fair  Insight:  Fair  Psychomotor Activity:  Normal  Concentration:  Fair  Recall:  [phone] of Knowledge:Fair  Language: Fair  Akathisia:  No  Handed:  Right  AIMS (if indicated):     Assets:  Desire for Improvement  Sleep:  Number of Hours: 7.75  Cognition: WNL  ADL's:  Intact   Mental Status Per Nursing Assessment::   On Admission:  NA  Demographic Factors:  Caucasian  Loss Factors: NA  Historical Factors: Impulsivity  Risk Reduction Factors:   Responsible for children under 62 years of age, Sense of responsibility to family, Religious beliefs about death, Living with another person, especially a relative, Positive social support and Positive therapeutic  relationship  Continued Clinical Symptoms:  Schizophrenia:   Paranoid or undifferentiated type  Cognitive Features That Contribute To Risk:  None    Suicide Risk:  Minimal: No identifiable suicidal ideation.  Patients presenting with no risk factors but with morbid ruminations; may be classified as minimal risk based on the severity of the depressive symptoms   Follow-up Information    [facility] Follow up.   Why: Please meet with 15, Peer Support Specialist, 02/12/2020 at 7AM via Zoom.  Thanks! Contact information: [address] [place] [phone]               Plan Of Care/Follow-up recommendations:  Activity:  Activity as tolerated Diet:  Regular diet Other:  Follow-up with outpatient treatment with [facility]  [phone], MD 02/06/2020, 11:52 AM

## 2020-02-06 NOTE — Progress Notes (Signed)
  [facility] Adult Case Management Discharge Plan :  Will you be returning to the same living situation after discharge:  Yes,  pt reports that she is returinging home. At discharge, do you have transportation home?: Yes,  pt reports that her sister will provide transportation. Do you have the ability to pay for your medications: No. Pt does not have insurance.  Release of information consent forms completed and in the chart;  Patient's signature needed at discharge.  Patient to Follow up at:  Follow-up Information    [facility] Follow up.   Why: Please meet with Lorella Nimrod, Peer Support Specialist, 02/12/2020 at 7AM via Zoom.  Thanks! Contact information: [address] [place] [address] [phone]               Next level of care provider has access to [facility] Link:no  Safety Planning and Suicide Prevention discussed: Yes,  SPE completed with pt's mother.  Have you used any form of tobacco in the last 30 days? (Cigarettes, Smokeless Tobacco, Cigars, and/or Pipes): Yes  Has patient been referred to the Quitline?: Patient refused referral  Patient has been referred for addiction treatment: Pt. refused referral  Harden Mo, LCSW 02/06/2020, 2:15 PM

## 2020-02-06 NOTE — Discharge Summary (Signed)
Physician Discharge Summary Note  Patient:  Cheryl Cook is an 39 y.o., female MRN:  [id number] DOB:  Dec 02, 1980 Patient phone:  [phone] (home)  Patient address:   [address] [place] [address],  Total Time spent with patient: 30 minutes  Date of Admission:  02/02/2020 Date of Discharge: 02/06/2020  Reason for Admission: Patient was admitted through the emergency room at the recommendation and petition filing of her sister who reports that the patient has been more paranoid and disorganized with poor self-care and poor care for her children  Principal Problem: Schizophrenia [facility]) Discharge Diagnoses: Principal Problem:   Schizophrenia ([place]) Active Problems:   Acute psychosis ([place])   Marijuana abuse   Past Psychiatric History: Patient has a long history of schizophrenia  Past Medical History:  Past Medical History:  Diagnosis Date  . Schizophrenia ([place])    History reviewed. No pertinent surgical history. Family History:  Family History  Problem Relation Age of Onset  . Arthritis Mother   . Asthma Daughter   . Asthma Son   . Diabetes Maternal Grandmother   . Hypertension Maternal Grandmother   . Glaucoma Maternal Grandmother   . Diabetes Maternal Grandfather   . Hypertension Maternal Grandfather   . Glaucoma Maternal Grandfather   . Aneurysm Maternal Grandfather   . Congestive Heart Failure Maternal Grandfather   . Asthma Half-Sister    Family Psychiatric  History: None reported Social History:  Social History   Substance and Sexual Activity  Alcohol Use No     Social History   Substance and Sexual Activity  Drug Use No    Social History   Socioeconomic History  . Marital status: Single    Spouse name: Not on file  . Number of children: Not on file  . Years of education: Not on file  . Highest education level: Not on file  Occupational History  . Not on file  Tobacco Use  . Smoking status: Current Every Day Smoker    Packs/day: 0.50     Types: Cigarettes  . Smokeless tobacco: Never Used  Vaping Use  . Vaping Use: Never used  Substance and Sexual Activity  . Alcohol use: No  . Drug use: No  . Sexual activity: Yes    Birth control/protection: None  Other Topics Concern  . Not on file  Social History Narrative  . Not on file   Social Determinants of Health   Financial Resource Strain:   . Difficulty of Paying Living Expenses:   Food Insecurity:   . Worried About Charity fundraiser in the Last Year:   . Arboriculturist in the Last Year:   Transportation Needs:   . Film/video editor (Medical):   Marland Kitchen Lack of Transportation (Non-Medical):   Physical Activity:   . Days of Exercise per Week:   . Minutes of Exercise per Session:   Stress:   . Feeling of Stress :   Social Connections:   . Frequency of Communication with Friends and Family:   . Frequency of Social Gatherings with Friends and Family:   . Attends Religious Services:   . Active Member of Clubs or Organizations:   . Attends Archivist Meetings:   Marland Kitchen Marital Status:     Hospital Course: Patient was started back on psychiatric medicine.  She was compliant with medicine in the hospital.  She had no behavior problems while she was here.  She rapidly resolved her paranoia and at the time  of discharge appears to be thinking clearly.  No sign of response to internal stimuli.  Denies any hallucinations.  Denies suicidal or homicidal thoughts.  Patient received counseling about the importance of staying compliant with her medication and she agrees to this.  She met with the representative from [facility] and is agreeable to follow-up treatment through [facility].  7-day supply given at discharge as well as regular prescriptions being sent to the medication management clinic  Physical Findings: AIMS:  , ,  ,  ,    CIWA:    COWS:     Musculoskeletal: Strength & Muscle Tone: within normal limits Gait & Station: normal Patient leans: N/A  Psychiatric Specialty  Exam: Physical Exam  Nursing note and vitals reviewed. Constitutional: She appears well-developed.  HENT:  Head: Normocephalic and atraumatic.  Eyes: Pupils are equal, round, and reactive to light. Conjunctivae are normal.  Cardiovascular: Normal heart sounds.  Respiratory: Effort normal.  GI: Soft.  Musculoskeletal:        General: Normal range of motion.     Cervical back: Normal range of motion.  Neurological: She is alert.  Skin: Skin is warm and dry.  Psychiatric: Mood and thought content normal.    Review of Systems  Constitutional: Negative.   HENT: Negative.   Eyes: Negative.   Respiratory: Negative.   Cardiovascular: Negative.   Gastrointestinal: Negative.   Musculoskeletal: Negative.   Skin: Negative.   Neurological: Negative.   Psychiatric/Behavioral: Negative.     Blood pressure 114/88, pulse 77, temperature 98.2 F (36.8 C), temperature source Oral, resp. rate 17, height 5' 3"  (1.6 m), weight 75.3 kg, SpO2 100 %.Body mass index is 29.41 kg/m.  General Appearance: Casual  Eye Contact:  Good  Speech:  Clear and Coherent  Volume:  Normal  Mood:  Euthymic  Affect:  Congruent  Thought Process:  Goal Directed  Orientation:  Full (Time, Place, and Person)  Thought Content:  Logical  Suicidal Thoughts:  No  Homicidal Thoughts:  No  Memory:  Immediate;   Fair Recent;   Fair Remote;   Fair  Judgement:  Fair  Insight:  Fair  Psychomotor Activity:  Normal  Concentration:  Concentration: Fair  Recall:  [place] of Knowledge:  Fair  Language:  Fair  Akathisia:  No  Handed:  Right  AIMS (if indicated):     Assets:  Desire for Improvement Housing Physical Health Resilience Social Support  ADL's:  Intact  Cognition:  WNL  Sleep:  Number of Hours: 7.75     Have you used any form of tobacco in the last 30 days? (Cigarettes, Smokeless Tobacco, Cigars, and/or Pipes): Yes  Has this patient used any form of tobacco in the last 30 days? (Cigarettes,  Smokeless Tobacco, Cigars, and/or Pipes) Yes, No  Blood Alcohol level:  Lab Results  Component Value Date   ETH <10 02/01/2020   ETH <10 46/65/9935    Metabolic Disorder Labs:  Lab Results  Component Value Date   HGBA1C 6.0 (H) 02/03/2020   MPG 125.5 02/03/2020   No results found for: PROLACTIN Lab Results  Component Value Date   CHOL 134 02/06/2019   TRIG 77 02/06/2019   HDL 39 (L) 02/06/2019   CHOLHDL 3.4 02/06/2019   VLDL 15 02/06/2019   [place] 80 02/06/2019    See Psychiatric Specialty Exam and Suicide Risk Assessment completed by Attending Physician prior to discharge.  Discharge destination:  Home  Is patient on multiple antipsychotic therapies at discharge:  No   Has Patient had three or more failed trials of antipsychotic monotherapy by history:  No  Recommended Plan for Multiple Antipsychotic Therapies: NA  Discharge Instructions    Diet - low sodium heart healthy   Complete by: As directed    Increase activity slowly   Complete by: As directed      Allergies as of 02/06/2020   No Known Allergies     Medication List    STOP taking these medications   multivitamin with minerals Tabs tablet     TAKE these medications     Indication  citalopram 20 MG tablet Commonly known as: CELEXA Take 1 tablet (20 mg total) by mouth daily. Start taking on: February 07, 2020  Indication: Depression   OLANZapine 10 MG tablet Commonly known as: ZYPREXA Take 1 tablet (10 mg total) by mouth at bedtime.  Indication: Psychotic Depressive Illness   pantoprazole 40 MG tablet Commonly known as: PROTONIX Take 1 tablet (40 mg total) by mouth daily. Start taking on: February 07, 2020  Indication: Gastroesophageal Reflux Disease   traZODone 50 MG tablet Commonly known as: DESYREL Take 1 tablet (50 mg total) by mouth at bedtime as needed for sleep.  Indication: [place] Follow up.   Why: Please meet with  Lanae Boast, Peer Support Specialist, 02/12/2020 at [place] via [place].  Thanks! Contact information: [place] [address] [phone]               Follow-up recommendations:  Activity:  Activity as tolerated Diet:  Regular diet Other:  Follow-up with [facility]  Comments: Prescriptions provided at discharge  Signed: Alethia Berthold, MD 02/06/2020, 12:26 PM

## 2020-02-06 NOTE — Plan of Care (Signed)
Problem: Group Participation °Goal: STG - Patient will engage in groups without prompting or encouragement from LRT x3 group sessions within 5 recreation therapy group sessions °Description: STG - Patient will engage in groups without prompting or encouragement from LRT x3 group sessions within 5 recreation therapy group sessions °Outcome: Completed/Met °  °

## 2020-02-06 NOTE — Progress Notes (Signed)
D- Patient alert and oriented. Patient presents in a pleasant mood on assessment stating that she slept "pretty good" last night and had no complaints to voice to this Clinical research associate. Patient denies any signs/symptoms of depression/anxiety, reporting that she is excited to be going home. Patient also denies SI, HI, AVH, and pain at this time. Patient's goal for today is to "keep on doing good", in which she will "stay strong", in order to achieve her goal.  A- Scheduled medications administered to patient, per MD orders. Support and encouragement provided.  Routine safety checks conducted every 15 minutes. Patient informed to notify staff with problems or concerns.  R- No adverse drug reactions noted. Patient contracts for safety at this time. Patient compliant with medications and treatment plan. Patient receptive, calm, and cooperative. Patient interacts well with others on the unit.  Patient remains safe at this time.

## 2020-02-06 NOTE — Progress Notes (Signed)
Recreation Therapy Notes  INPATIENT RECREATION TR PLAN  Patient Details Name: Cheryl Cook MRN: [id number] DOB: 01-Sep-1980 Today's Date: 02/06/2020  Rec Therapy Plan Is patient appropriate for Therapeutic Recreation?: Yes Treatment times per week: at least 3 Estimated Length of Stay: 5-7 days TR Treatment/Interventions: Group participation (Comment)  Discharge Criteria Pt will be discharged from therapy if:: Discharged Treatment plan/goals/alternatives discussed and agreed upon by:: Patient/family  Discharge Summary Short term goals set: Patient will engage in groups without prompting or encouragement from LRT x3 group sessions within 5 recreation therapy group sessions Short term goals met: Complete Progress toward goals comments: Groups attended Which groups?: Other (Comment) (Relaxation, Strengths) Reason goals not met: N/A Therapeutic equipment acquired: N/A Reason patient discharged from therapy: Discharge from hospital Pt/family agrees with progress & goals achieved: Yes Date patient discharged from therapy: 02/06/20   Abrielle Finck 02/06/2020, 1:41 PM

## 2020-02-13 ENCOUNTER — Other Ambulatory Visit: Payer: Self-pay

## 2020-02-13 ENCOUNTER — Ambulatory Visit (LOCAL_COMMUNITY_HEALTH_CENTER): Payer: Self-pay

## 2020-02-13 VITALS — BP 119/89 | Ht 63.0 in | Wt 180.0 lb

## 2020-02-13 DIAGNOSIS — Z30013 Encounter for initial prescription of injectable contraceptive: Secondary | ICD-10-CM

## 2020-02-13 DIAGNOSIS — Z3009 Encounter for other general counseling and advice on contraception: Secondary | ICD-10-CM

## 2020-02-13 DIAGNOSIS — Z3200 Encounter for pregnancy test, result unknown: Secondary | ICD-10-CM

## 2020-02-13 LAB — PREGNANCY, URINE: Preg Test, Ur: NEGATIVE

## 2020-02-13 NOTE — Progress Notes (Signed)
Pt. Here for depo. Sent to lab for UPT per Dr. Kirtland Bouchard. [place] order dated 11/14/19. UPT results negative. Pt. Is 13 weeks post depo. Depo 150 mg IM given today per order by K. Alvester Morin, MD dated 11/14/19. Pt tolerated without difficulty. Depo consent signed and sent for scanning. Next depo due 04/30/20. Jerel Shepherd, RN

## 2020-07-26 ENCOUNTER — Emergency Department
Admission: EM | Admit: 2020-07-26 | Discharge: 2020-07-27 | Disposition: A | Discharge status: Other Acute Inpatient Hospital | Payer: Medicaid Other | Attending: Emergency Medicine | Admitting: Emergency Medicine

## 2020-07-26 ENCOUNTER — Other Ambulatory Visit: Payer: Self-pay

## 2020-07-26 DIAGNOSIS — F200 Paranoid schizophrenia: Secondary | ICD-10-CM | POA: Diagnosis not present

## 2020-07-26 DIAGNOSIS — T43212A Poisoning by selective serotonin and norepinephrine reuptake inhibitors, intentional self-harm, initial encounter: Secondary | ICD-10-CM | POA: Insufficient documentation

## 2020-07-26 DIAGNOSIS — F17210 Nicotine dependence, cigarettes, uncomplicated: Secondary | ICD-10-CM | POA: Diagnosis not present

## 2020-07-26 DIAGNOSIS — F329 Major depressive disorder, single episode, unspecified: Secondary | ICD-10-CM | POA: Diagnosis not present

## 2020-07-26 DIAGNOSIS — F1210 Cannabis abuse, uncomplicated: Secondary | ICD-10-CM | POA: Diagnosis present

## 2020-07-26 DIAGNOSIS — Z8659 Personal history of other mental and behavioral disorders: Secondary | ICD-10-CM

## 2020-07-26 DIAGNOSIS — F209 Schizophrenia, unspecified: Secondary | ICD-10-CM | POA: Diagnosis present

## 2020-07-26 DIAGNOSIS — Z20822 Contact with and (suspected) exposure to covid-19: Secondary | ICD-10-CM | POA: Diagnosis not present

## 2020-07-26 DIAGNOSIS — T43211A Poisoning by selective serotonin and norepinephrine reuptake inhibitors, accidental (unintentional), initial encounter: Secondary | ICD-10-CM

## 2020-07-26 DIAGNOSIS — T50902A Poisoning by unspecified drugs, medicaments and biological substances, intentional self-harm, initial encounter: Secondary | ICD-10-CM

## 2020-07-26 LAB — COMPREHENSIVE METABOLIC PANEL
ALT: 17 U/L (ref 0–44)
AST: 21 U/L (ref 15–41)
Albumin: 4 g/dL (ref 3.5–5.0)
Alkaline Phosphatase: 64 U/L (ref 38–126)
Anion gap: 13 (ref 5–15)
BUN: 5 mg/dL — ABNORMAL LOW (ref 6–20)
CO2: 24 mmol/L (ref 22–32)
Calcium: 8.9 mg/dL (ref 8.9–10.3)
Chloride: 100 mmol/L (ref 98–111)
Creatinine, Ser: 0.78 mg/dL (ref 0.44–1.00)
GFR, Estimated: >60 mL/min (ref >60)
Glucose, Bld: 145 mg/dL — ABNORMAL HIGH (ref 70–99)
Potassium: 2.7 mmol/L — CL (ref 3.5–5.1)
Sodium: 137 mmol/L (ref 135–145)
Total Bilirubin: 1.1 mg/dL (ref 0.3–1.2)
Total Protein: 7.8 g/dL (ref 6.5–8.1)

## 2020-07-26 LAB — CBC
HCT: 44 % (ref 36.0–46.0)
Hemoglobin: 15.2 g/dL — ABNORMAL HIGH (ref 12.0–15.0)
MCH: 31.4 pg (ref 26.0–34.0)
MCHC: 34.5 g/dL (ref 30.0–36.0)
MCV: 90.9 fL (ref 80.0–100.0)
Platelets: 283 10*3/uL (ref 150–400)
RBC: 4.84 MIL/uL (ref 3.87–5.11)
RDW: 13 % (ref 11.5–15.5)
WBC: 8.8 10*3/uL (ref 4.0–10.5)
nRBC: 0 % (ref 0.0–0.2)

## 2020-07-26 LAB — LIPID PANEL
Cholesterol: 149 mg/dL (ref 0–200)
HDL: 39 mg/dL — ABNORMAL LOW (ref >40)
LDL Cholesterol: 93 mg/dL (ref 0–99)
Total CHOL/HDL Ratio: 3.8 RATIO
Triglycerides: 87 mg/dL (ref <150)
VLDL: 17 mg/dL (ref 0–40)

## 2020-07-26 LAB — URINE DRUG SCREEN, QUALITATIVE (ARMC ONLY)
Amphetamines, Ur Screen: NOT DETECTED
Barbiturates, Ur Screen: NOT DETECTED
Benzodiazepine, Ur Scrn: NOT DETECTED
Cannabinoid 50 Ng, Ur ~~LOC~~: POSITIVE — AB
Cocaine Metabolite,Ur ~~LOC~~: NOT DETECTED
MDMA (Ecstasy)Ur Screen: NOT DETECTED
Methadone Scn, Ur: NOT DETECTED
Opiate, Ur Screen: NOT DETECTED
Phencyclidine (PCP) Ur S: NOT DETECTED
Tricyclic, Ur Screen: NOT DETECTED

## 2020-07-26 LAB — RESP PANEL BY RT-PCR (FLU A&B, COVID) ARPGX2
Influenza A by PCR: NEGATIVE
Influenza B by PCR: NEGATIVE
SARS Coronavirus 2 by RT PCR: NEGATIVE

## 2020-07-26 LAB — POC URINE PREG, ED: Preg Test, Ur: NEGATIVE

## 2020-07-26 LAB — ACETAMINOPHEN LEVEL: Acetaminophen (Tylenol), Serum: <10 ug/mL — ABNORMAL LOW (ref 10–30)

## 2020-07-26 LAB — SALICYLATE LEVEL: Salicylate Lvl: <7 mg/dL — ABNORMAL LOW (ref 7.0–30.0)

## 2020-07-26 LAB — ETHANOL: Alcohol, Ethyl (B): <10 mg/dL (ref <10)

## 2020-07-26 MED ORDER — OLANZAPINE 10 MG PO TABS
10.0000 mg | ORAL_TABLET | Freq: Every day | ORAL | Status: DC
  Administered 2020-07-26: 10 mg via ORAL
  Filled 2020-07-26: qty 1

## 2020-07-26 MED ORDER — PANTOPRAZOLE SODIUM 40 MG PO TBEC
40.0000 mg | DELAYED_RELEASE_TABLET | Freq: Every day | ORAL | Status: DC
  Administered 2020-07-26 – 2020-07-27 (×2): 40 mg via ORAL
  Filled 2020-07-26 (×2): qty 1

## 2020-07-26 MED ORDER — POTASSIUM CHLORIDE CRYS ER 20 MEQ PO TBCR
40.0000 meq | EXTENDED_RELEASE_TABLET | Freq: Two times a day (BID) | ORAL | Status: DC
  Administered 2020-07-26 – 2020-07-27 (×3): 40 meq via ORAL
  Filled 2020-07-26 (×3): qty 2

## 2020-07-26 MED ORDER — TRAZODONE HCL 50 MG PO TABS: 50.0000 mg | ORAL_TABLET | Freq: Every day | ORAL | Status: DC

## 2020-07-26 MED ORDER — CITALOPRAM HYDROBROMIDE 20 MG PO TABS
20.0000 mg | ORAL_TABLET | Freq: Every day | ORAL | Status: DC
  Administered 2020-07-26 – 2020-07-27 (×2): 20 mg via ORAL
  Filled 2020-07-26 (×2): qty 1

## 2020-07-26 MED ORDER — NICOTINE 21 MG/24HR TD PT24
21.0000 mg | MEDICATED_PATCH | Freq: Every day | TRANSDERMAL | Status: DC
  Administered 2020-07-26 – 2020-07-27 (×2): 21 mg via TRANSDERMAL
  Filled 2020-07-26 (×2): qty 1
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
## 2020-07-26 NOTE — ED Notes (Signed)
Hourly rounding revealed patient in room. No complaints, stable, in no acute distress. Q15 rounds and monitoring via Rover and Officer to continue.  

## 2020-07-26 NOTE — ED Notes (Signed)
Hourly rounding revealed patient in room. No complaints, stable, in no acute stress. Q15 minute rounds and monitoring via Rover and Officer to continue. 

## 2020-07-26 NOTE — ED Triage Notes (Signed)
She arrives today via [facility] from home - family called 911 due to pt reported overdose   Alert and oriented upon arrival  Verbalizing depression  "I think I took 49 Trazodone"

## 2020-07-26 NOTE — ED Notes (Signed)
Pt updated on call with mother per pt's request and permission

## 2020-07-26 NOTE — ED Notes (Signed)
Spoke with Beth at [facility]  - pt has been cleared medically

## 2020-07-26 NOTE — ED Notes (Signed)
Pt placed on 12 lead, given sprite with ice and blankets.

## 2020-07-26 NOTE — BH Assessment (Signed)
Assessment Note  Cheryl Cook is an 39 y.o. female who presents to the ER due to intentional overdose to end her life. Patient states, she has dealt with depression and the holidays, she noticed her symptoms has increased. She she's unsure of the specific stressor that caused her to ingest the pills. She currently receives mental health services with [facility] but missed her last three appointments with her psychiatrist. She states he have support from her family but the depression is too much for her to manage in her current state.  During the interview, the patient was calm, cooperative and pleasant. She was able to provide appropriate answers to the questions. She denies HI and AV/H. She also denies history of aggression and violence.   Diagnosis: Major depression  Past Medical History:  Past Medical History:  Diagnosis Date  . Schizophrenia ([name])    No past surgical history on file.  Family History:  Family History  Problem Relation Age of Onset  . Arthritis Mother   . Asthma Daughter   . Asthma Son   . Diabetes Maternal Grandmother   . Hypertension Maternal Grandmother   . Glaucoma Maternal Grandmother   . Diabetes Maternal Grandfather   . Hypertension Maternal Grandfather   . Glaucoma Maternal Grandfather   . Aneurysm Maternal Grandfather   . Congestive Heart Failure Maternal Grandfather   . Asthma Half-Sister     Social History:  reports that she has been smoking cigarettes. She has been smoking about 0.50 packs per day. She has never used smokeless tobacco. She reports that she does not drink alcohol and does not use drugs.  Additional Social History:  Alcohol / Drug Use Pain Medications: See PTA Prescriptions: See PTA Over the Counter: See PTA History of alcohol / drug use?: Yes Longest period of sobriety (when/how long): Unable to quantify Substance #1 Name of Substance 1: Alcohol 1 - Last Use / Amount: "A year ago." Substance #2 Name of Substance 2: Cannabis 2 - Last  Use / Amount: "A month ago."  CIWA: CIWA-Ar BP: 115/78 Pulse Rate: 86 COWS:    Allergies: No Known Allergies  Home Medications: (Not in a hospital admission)   OB/GYN Status:  No LMP recorded. (Menstrual status: Irregular Periods).  General Assessment Data Location of Assessment: [facility] ED TTS Assessment: In system Is this a Tele or Face-to-Face Assessment?: Face-to-Face Is this an Initial Assessment or a Re-assessment for this encounter?: Initial Assessment Patient Accompanied by:: N/A Language Other than English: No Living Arrangements: Other (Comment) (Private Home) What gender do you identify as?: Female Date Telepsych consult ordered in [facility]: 07/26/20 Time Telepsych consult ordered in [facility]: 1031 Marital status: Single Pregnancy Status: No Living Arrangements: Children, Parent Can pt return to current living arrangement?: Yes Admission Status: Involuntary Petitioner: ED Attending Is patient capable of signing voluntary admission?: No (Under IVC) Referral Source: Self/Family/Friend Insurance type: Medicaid  Medical Screening Exam [facility] Walk-in ONLY) Medical Exam completed: Yes  Crisis Care Plan Living Arrangements: Children, Parent Legal Guardian: Other: (Self) Name of Psychiatrist: Dr. Georjean Mode ([facility]) Name of Therapist: [facility]  Education Status Is patient currently in school?: No Is the patient employed, unemployed or receiving disability?: Unemployed  Risk to self with the past 6 months Suicidal Ideation: Yes-Currently Present Has patient been a risk to self within the past 6 months prior to admission? : Yes Suicidal Intent: Yes-Currently Present Has patient had any suicidal intent within the past 6 months prior to admission? : Yes Is patient at risk  for suicide?: Yes Suicidal Plan?: Yes-Currently Present Has patient had any suicidal plan within the past 6 months prior to admission? : Yes Specify Current Suicidal Plan: Overdose Access to Means: Yes Specify Access to  Suicidal Means: Overdose What has been your use of drugs/alcohol within the last 12 months?: Cannabis & Alcohol Previous Attempts/Gestures: Yes How many times?: 1 Other Self Harm Risks: Active Addiction Triggers for Past Attempts: None known Intentional Self Injurious Behavior: None Family Suicide History: Unknown Recent stressful life event(s): Other (Comment) Persecutory voices/beliefs?: No Depression: Yes Depression Symptoms: Isolating, Feeling worthless/self pity Substance abuse history and/or treatment for substance abuse?: No Suicide prevention information given to non-admitted patients: Not applicable  Risk to Others within the past 6 months Homicidal Ideation: No Does patient have any lifetime risk of violence toward others beyond the six months prior to admission? : No Thoughts of Harm to Others: No Current Homicidal Intent: No Current Homicidal Plan: No Access to Homicidal Means: No Identified Victim: Reports of none History of harm to others?: No Assessment of Violence: None Noted Violent Behavior Description: Reports of none Does patient have access to weapons?: No Criminal Charges Pending?: No Does patient have a court date: No Is patient on probation?: No  Psychosis Hallucinations: None noted Delusions: None noted  Mental Status Report Appearance/Hygiene: Unremarkable, In scrubs Eye Contact: Fair Motor Activity: Freedom of movement, Unremarkable Speech: Logical/coherent, Unremarkable Level of Consciousness: Alert Mood: Sad, Pleasant, Depressed Affect: Anxious, Sad, Depressed, Appropriate to circumstance Anxiety Level: Minimal Thought Processes: Coherent, Relevant Judgement: Unimpaired Orientation: Person, Place, Time, Situation, Appropriate for developmental age Obsessive Compulsive Thoughts/Behaviors: None  Cognitive Functioning Concentration: Normal Memory: Recent Intact, Remote Intact Is patient IDD: No Insight: Fair Impulse Control:  Fair Appetite: Fair Have you had any weight changes? : No Change Sleep: Decreased Total Hours of Sleep: 4 Vegetative Symptoms: None  ADLScreening [facility] Assessment Services) Patient's cognitive ability adequate to safely complete daily activities?: Yes Patient able to express need for assistance with ADLs?: Yes Independently performs ADLs?: Yes (appropriate for developmental age)  Prior Inpatient Therapy Prior Inpatient Therapy: Yes Prior Therapy Dates: 01/2020, 01/2019 & 06/2001 Prior Therapy Facilty/Provider(s): [facility] Reason for Treatment: Depression  Prior Outpatient Therapy Prior Outpatient Therapy: Yes Prior Therapy Dates: Current Prior Therapy Facilty/Provider(s): [facility] Reason for Treatment: Depression Does patient have an ACCT team?: No Does patient have Intensive In-House Services?  : No Does patient have [facility] services? : No Does patient have P4CC services?: No  ADL Screening (condition at time of admission) Patient's cognitive ability adequate to safely complete daily activities?: Yes Is the patient deaf or have difficulty hearing?: No Does the patient have difficulty seeing, even when wearing glasses/contacts?: No Does the patient have difficulty concentrating, remembering, or making decisions?: No Patient able to express need for assistance with ADLs?: Yes Does the patient have difficulty dressing or bathing?: No Independently performs ADLs?: Yes (appropriate for developmental age) Does the patient have difficulty walking or climbing stairs?: No Weakness of Legs: None Weakness of Arms/Hands: None  Home Assistive Devices/Equipment Home Assistive Devices/Equipment: None  Therapy Consults (therapy consults require a physician order) PT Evaluation Needed: No OT Evalulation Needed: No SLP Evaluation Needed: No Abuse/Neglect Assessment (Assessment to be complete while patient is alone) Abuse/Neglect Assessment Can Be Completed: Yes Physical Abuse:  Denies Verbal Abuse: Denies Sexual Abuse: Denies Self-Neglect: Denies Values / Beliefs Cultural Requests During Hospitalization: None Spiritual Requests During Hospitalization: None Consults Spiritual Care Consult Needed: No Transition of Care  Team Consult Needed: No Advance Directives (For Healthcare) Does Patient Have a Medical Advance Directive?: No Would patient like information on creating a medical advance directive?: No - Patient declined  Disposition:  Disposition Initial Assessment Completed for this Encounter: Yes  On Site Evaluation by:   Reviewed with Physician:    Lilyan Gilford MS, LCAS, [facility], [facility] Therapeutic Triage Specialist 07/26/2020 3:41 PM

## 2020-07-26 NOTE — ED Notes (Signed)
Pt requesting call to pt's mother att  Advised on POC, and securing pills, mother reports pt not taking her meds for "some time", and pt "is hearing vioices and schizophrentic and has

## 2020-07-26 NOTE — ED Notes (Signed)
Hourly rounding revealed patient in too. No complaints, stable in no acute distress. Q15 minute rounds and monitoring via Psychologist, counselling to continue.

## 2020-07-26 NOTE — ED Notes (Signed)
Pt given meal tray.

## 2020-07-26 NOTE — ED Notes (Signed)
Hourly rounding revealed patient in room. No complaints, stable, in no acute distress. Q15 minute rounds and monitoring via Rover and Officer to continue.  

## 2020-07-26 NOTE — ED Notes (Signed)
Lab to do add on for lipid and A1C.

## 2020-07-26 NOTE — ED Notes (Signed)
Gave food tray with juice. 
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
## 2020-07-26 NOTE — ED Notes (Signed)
Pt given apple juice and unhooked from cardiac monitor to go to the bathroom.

## 2020-07-26 NOTE — ED Notes (Signed)
Pt. Transferred to BHU from ED to room 3 after screening for contraband. Report to include Situation, Background, Assessment and Recommendations from [facility]. Pt. Oriented to unit including Q15 minute rounds as well as the security cameras for their protection. Patient is alert and oriented, warm and dry in no acute distress. Patient denies SI, HI, and AVH. Pt. Encouraged to let me know if needs arise.

## 2020-07-26 NOTE — ED Notes (Signed)
Pt belongings 1 out of 1 bags: Navy blue sweatshirt Grey leggings Grey socks Tan T-shirt Tan bra Navy blue underwear Black ponytail holder

## 2020-07-26 NOTE — ED Notes (Signed)
Spoke with poison control   - recommendation for cardiac monitoring for 4-6 hours, EKG now and EKG in 6hrs

## 2020-07-27 ENCOUNTER — Encounter: Payer: Self-pay | Admitting: Psychiatry

## 2020-07-27 ENCOUNTER — Other Ambulatory Visit: Payer: Self-pay

## 2020-07-27 ENCOUNTER — Inpatient Hospital Stay
Admission: AD | Admit: 2020-07-27 | Discharge: 2020-08-01 | DRG: 885 | Disposition: A | Discharge status: Home / Self Care | Payer: Medicaid Other | Source: Intra-hospital | Attending: Behavioral Health | Admitting: Behavioral Health

## 2020-07-27 DIAGNOSIS — Z8261 Family history of arthritis: Secondary | ICD-10-CM

## 2020-07-27 DIAGNOSIS — Z833 Family history of diabetes mellitus: Secondary | ICD-10-CM | POA: Diagnosis not present

## 2020-07-27 DIAGNOSIS — F200 Paranoid schizophrenia: Secondary | ICD-10-CM | POA: Diagnosis present

## 2020-07-27 DIAGNOSIS — G479 Sleep disorder, unspecified: Secondary | ICD-10-CM | POA: Diagnosis present

## 2020-07-27 DIAGNOSIS — Z79899 Other long term (current) drug therapy: Secondary | ICD-10-CM

## 2020-07-27 DIAGNOSIS — K219 Gastro-esophageal reflux disease without esophagitis: Secondary | ICD-10-CM | POA: Diagnosis present

## 2020-07-27 DIAGNOSIS — Z8659 Personal history of other mental and behavioral disorders: Secondary | ICD-10-CM

## 2020-07-27 DIAGNOSIS — Z818 Family history of other mental and behavioral disorders: Secondary | ICD-10-CM | POA: Diagnosis not present

## 2020-07-27 DIAGNOSIS — Z825 Family history of asthma and other chronic lower respiratory diseases: Secondary | ICD-10-CM | POA: Diagnosis not present

## 2020-07-27 DIAGNOSIS — T43211A Poisoning by selective serotonin and norepinephrine reuptake inhibitors, accidental (unintentional), initial encounter: Secondary | ICD-10-CM | POA: Diagnosis present

## 2020-07-27 DIAGNOSIS — F17210 Nicotine dependence, cigarettes, uncomplicated: Secondary | ICD-10-CM | POA: Diagnosis present

## 2020-07-27 DIAGNOSIS — F411 Generalized anxiety disorder: Secondary | ICD-10-CM | POA: Diagnosis present

## 2020-07-27 DIAGNOSIS — F4310 Post-traumatic stress disorder, unspecified: Secondary | ICD-10-CM | POA: Diagnosis present

## 2020-07-27 DIAGNOSIS — T43212A Poisoning by selective serotonin and norepinephrine reuptake inhibitors, intentional self-harm, initial encounter: Secondary | ICD-10-CM | POA: Diagnosis present

## 2020-07-27 DIAGNOSIS — F39 Unspecified mood [affective] disorder: Secondary | ICD-10-CM | POA: Diagnosis present

## 2020-07-27 DIAGNOSIS — F251 Schizoaffective disorder, depressive type: Secondary | ICD-10-CM | POA: Diagnosis present

## 2020-07-27 DIAGNOSIS — Z9151 Personal history of suicidal behavior: Secondary | ICD-10-CM

## 2020-07-27 DIAGNOSIS — Z8249 Family history of ischemic heart disease and other diseases of the circulatory system: Secondary | ICD-10-CM | POA: Diagnosis not present

## 2020-07-27 DIAGNOSIS — F1210 Cannabis abuse, uncomplicated: Secondary | ICD-10-CM | POA: Diagnosis present

## 2020-07-27 LAB — BASIC METABOLIC PANEL
Anion gap: 8 (ref 5–15)
BUN: 7 mg/dL (ref 6–20)
CO2: 27 mmol/L (ref 22–32)
Calcium: 8.7 mg/dL — ABNORMAL LOW (ref 8.9–10.3)
Chloride: 105 mmol/L (ref 98–111)
Creatinine, Ser: 0.87 mg/dL (ref 0.44–1.00)
GFR, Estimated: >60 mL/min (ref >60)
Glucose, Bld: 118 mg/dL — ABNORMAL HIGH (ref 70–99)
Potassium: 3.3 mmol/L — ABNORMAL LOW (ref 3.5–5.1)
Sodium: 140 mmol/L (ref 135–145)

## 2020-07-27 LAB — HEMOGLOBIN A1C
Hgb A1c MFr Bld: 5.6 % (ref 4.8–5.6)
Mean Plasma Glucose: 114.02 mg/dL

## 2020-07-27 MED ORDER — MELATONIN 3 MG PO TABS
3.0000 mg | ORAL_TABLET | Freq: Every day | ORAL | Status: DC
  Filled 2020-07-27: qty 1

## 2020-07-27 MED ORDER — OLANZAPINE 10 MG PO TABS
10.0000 mg | ORAL_TABLET | Freq: Every day | ORAL | Status: DC
  Administered 2020-07-27 – 2020-07-31 (×5): 10 mg via ORAL
  Filled 2020-07-27 (×5): qty 1

## 2020-07-27 MED ORDER — MELATONIN 5 MG PO TABS
2.5000 mg | ORAL_TABLET | Freq: Every day | ORAL | Status: DC
  Administered 2020-07-27 – 2020-07-31 (×5): 2.5 mg via ORAL
  Filled 2020-07-27 (×5): qty 1

## 2020-07-27 MED ORDER — ACETAMINOPHEN 325 MG PO TABS
650.0000 mg | ORAL_TABLET | Freq: Four times a day (QID) | ORAL | Status: DC | PRN
  Administered 2020-07-28: 650 mg via ORAL
  Filled 2020-07-27: qty 2

## 2020-07-27 MED ORDER — PANTOPRAZOLE SODIUM 40 MG PO TBEC
40.0000 mg | DELAYED_RELEASE_TABLET | Freq: Every day | ORAL | Status: DC
  Administered 2020-07-27 – 2020-08-01 (×6): 40 mg via ORAL
  Filled 2020-07-27 (×6): qty 1

## 2020-07-27 MED ORDER — POTASSIUM CHLORIDE CRYS ER 20 MEQ PO TBCR
40.0000 meq | EXTENDED_RELEASE_TABLET | Freq: Two times a day (BID) | ORAL | Status: DC
  Administered 2020-07-27 – 2020-07-31 (×8): 40 meq via ORAL
  Filled 2020-07-27 (×8): qty 2

## 2020-07-27 MED ORDER — CITALOPRAM HYDROBROMIDE 20 MG PO TABS
20.0000 mg | ORAL_TABLET | Freq: Every day | ORAL | Status: DC
  Administered 2020-07-27 – 2020-07-28 (×2): 20 mg via ORAL
  Filled 2020-07-27 (×2): qty 1

## 2020-07-27 MED ORDER — NICOTINE 21 MG/24HR TD PT24
21.0000 mg | MEDICATED_PATCH | Freq: Every day | TRANSDERMAL | Status: DC
  Administered 2020-07-28 – 2020-08-01 (×5): 21 mg via TRANSDERMAL
  Filled 2020-07-27 (×5): qty 1

## 2020-07-27 MED ORDER — TRAZODONE HCL 50 MG PO TABS: 50.0000 mg | ORAL_TABLET | Freq: Every day | ORAL | Status: DC

## 2020-07-27 MED ORDER — ALUM & MAG HYDROXIDE-SIMETH 200-200-20 MG/5ML PO SUSP: 30.0000 mL | ORAL | Status: DC | PRN

## 2020-07-27 MED ORDER — MAGNESIUM HYDROXIDE 400 MG/5ML PO SUSP
30.0000 mL | Freq: Every day | ORAL | Status: DC | PRN
  Administered 2020-07-28: 30 mL via ORAL
  Filled 2020-07-27: qty 30

## 2020-07-27 MED ORDER — CITALOPRAM HYDROBROMIDE 20 MG PO TABS: 20.0000 mg | ORAL_TABLET | Freq: Every day | ORAL | Status: DC

## 2020-07-27 MED ORDER — HYDROXYZINE HCL 25 MG PO TABS
25.0000 mg | ORAL_TABLET | Freq: Three times a day (TID) | ORAL | Status: DC | PRN
  Administered 2020-07-27 – 2020-08-01 (×4): 25 mg via ORAL
  Filled 2020-07-27 (×4): qty 1

## 2020-07-27 MED ORDER — ZOLPIDEM TARTRATE 5 MG PO TABS
5.0000 mg | ORAL_TABLET | Freq: Every evening | ORAL | Status: DC | PRN
  Administered 2020-07-27 – 2020-07-31 (×5): 5 mg via ORAL
  Filled 2020-07-27 (×5): qty 1

## 2020-07-27 NOTE — ED Notes (Signed)
VS not taken, patient asleep 

## 2020-07-27 NOTE — ED Notes (Signed)
Report given to Ander Slade, RN in [facility]
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
## 2020-07-27 NOTE — [facility] Group Notes (Signed)
[facility] Group Notes: (Clinical Social Work)   07/27/2020      Type of Therapy:  Group Therapy   Participation Level:  Did Not Attend - was invited individually by Nurse/MHT and chose not to attend.   Susa Simmonds, LCSWA 07/27/2020  4:28 PM

## 2020-07-27 NOTE — ED Notes (Signed)
Hourly rounding reveals patient in room. No complaints, stable, in no acute distress. Q15 minute rounds and monitoring via Security Cameras to continue. 

## 2020-07-27 NOTE — ED Notes (Signed)
Pt given breakfast tray
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
## 2020-07-27 NOTE — [facility] Counselor (Signed)
Adult Comprehensive Assessment  Patient ID: Cheryl Cook, female   DOB: 11/06/1980, 39 y.o.   MRN: [id number]  Information Source: Information source: Patient  Current Stressors:  Patient states their primary concerns and needs for treatment are:: Overdosed on some pills yesterday Patient states their goals for this hospitilization and ongoing recovery are:: "I really want to try to get myself together". Educational / Learning stressors: No Employment / Job issues: No Family Relationships: I get along with my family Financial / Lack of resources (include bankruptcy): Child support and mother helps her Housing / Lack of housing: Stable housing Physical health (include injuries & life threatening diseases): I'm not sure Social relationships: I have been at the house. I haven't gone out anywhere. Substance abuse: I drink a little Bereavement / Loss: No  Living/Environment/Situation:  Living Arrangements: Children, Parent Living conditions (as described by patient or guardian): They aren't the greatest but we deal with what we got Who else lives in the home?: Mother and children How long has patient lived in current situation?: 6-7 years What is atmosphere in current home: Chaotic (Sometimes Chaotic)  Family History:  Marital status: Single Are you sexually active?: Yes What is your sexual orientation?: heterosexual Has your sexual activity been affected by drugs, alcohol, medication, or emotional stress?: pt denies Does patient have children?: Yes How many children?: 5 How is patient's relationship with their children?: ages 42, 62, 36, 2 and 61. Pt reports a good relationship with all her children.  Childhood History:  By whom was/is the patient raised?: Mother Additional childhood history information: Pt reports not meeting her father until she was 73 yo Description of patient's relationship with caregiver when they were a child: "I was mama's baby" Patient's description of  current relationship with people who raised him/her: Close relationship with mother How were you disciplined when you got in trouble as a child/adolescent?: Whoopings Does patient have siblings?: Yes Number of Siblings: 4 Description of patient's current relationship with siblings: Pt has an older sister who she reports a good relationship with Did patient suffer any verbal/emotional/physical/sexual abuse as a child?: No Did patient suffer from severe childhood neglect?: No Has patient ever been sexually abused/assaulted/raped as an adolescent or adult?: No Was the patient ever a victim of a crime or a disaster?: No Witnessed domestic violence?: No Has patient been affected by domestic violence as an adult?: No  Education:  Highest grade of school patient has completed: 8th grade Currently a student?: No Learning disability?: Yes What learning problems does patient have?: Reading and math  Employment/Work Situation:   Employment situation: Unemployed Patient's job has been impacted by current illness: No What is the longest time patient has a held a job?: 12 years Where was the patient employed at that time?: [employer] Has patient ever been in the [employer]?: No  Financial Resources:   Surveyor, quantity resources: Sales executive, Medicaid, Support from parents / caregiver Does patient have a Lawyer or guardian?: No  Alcohol/Substance Abuse:   What has been your use of drugs/alcohol within the last 12 months?: Marijuana and alcohol If attempted suicide, did drugs/alcohol play a role in this?: Yes Alcohol/Substance Abuse Treatment Hx: Past Tx, Inpatient If yes, describe treatment: I don't remember Has alcohol/substance abuse ever caused legal problems?:  (Patient was not sure)  Social Support System:   Patient's Community Support System: Good Describe Community Support System: Patient has a close relationship with her mom Type of faith/religion: [employer]ri[employer] Knuckles How does  patient's faith  help to cope with current illness?: Sometimes  Leisure/Recreation:   Do You Have Hobbies?: Yes Leisure and Hobbies: "spend time with my kids"  Strengths/Needs:   What is the patient's perception of their strengths?: "At this point I don't know" Patient states they can use these personal strengths during their treatment to contribute to their recovery: Unsure Patient states these barriers may affect/interfere with their treatment: I don't know Patient states these barriers may affect their return to the community: There shouldn't be Other important information patient would like considered in planning for their treatment: I want to get myself together  Discharge Plan:   Currently receiving community mental health services: Yes (From Whom) ([facility]) Patient states concerns and preferences for aftercare planning are: Continue with [facility] Patient states they will know when they are safe and ready for discharge when: "I don't know" Does patient have access to transportation?: Yes Does patient have financial barriers related to discharge medications?: No Patient description of barriers related to discharge medications: None Will patient be returning to same living situation after discharge?: Yes  Summary/Recommendations:   Summary and Recommendations (to be completed by the evaluator): Patient is a 39 year old female who resides in [place]. Patient [facility] ED due to an overdose on medication. Patient has a history of schizophrenia. Patient stated that she is trying to get herself back on track. Patient denies any SI/AH/HI. Patient stated that she would like to continue with [facility] when she is discharged from the hospital. Patient will benefit from crisis stabilization, medication evaluation, group therapy and psychoeducation, in addition to case management for discharge planning. At discharge it is recommended that Patient adhere to the established discharge plan and continue  in treatment.  Susa Simmonds. 07/27/2020

## 2020-07-27 NOTE — ED Notes (Signed)
Medication Reconciliation Report  For Home History Technicians  HIGHLIGHTS:  1. The patient WAS personally interviewed 2. If not, what was the main source used: AVAILABLE CHART NOTES/DOCS 3. Does the patient appear to take any anti-coagulation agents (e.g. warfarin, Eliquis or Xarelto): NO 4. Does the patient appear to take any anti-convulsant agents (e.g. divalproex, levetiracetam or phenytoin): NO 5. Does the patient appear to use any insulin products (e.g. Lantus, Novolin or Humalog): NO 6. Does the patient appear to take any "beta-blockers" (e.g. metoprolol, carvedilol or bisoprolol: NO  BARRIERS:  1. Were there any barriers that prevented or complicated the medication reconciliation process: YES 2. If yes, what was the primary barrier encountered: Poor historian 3. Does the patient appear compliant with prescribed medications: NO 4. Does the patient express any barriers with compliance: UNABLE TO DETERMINE 5. What is the primary barrier the patient reports: None   NOTES:[Include any concerns, remarks or complaints the patient expresses regarding medication therapy. Any observations or other information that might be useful to the treatment team can also be included. Immediate needs or concerns should be referred to the RN or appropriate member of the treatment team.]  The patient was interviewed but was unable to provide much detailed information on current medication history. Patient admitted to not recently taking medications prescribed in June 2021. She does report taking a "nerve" pill, but unable to elucidate specifics.   Elmo Putt, CPhT [place] [facility] [address] [place], [place] [address] [phone]  ** The above is intended solely for informational and/or communicative purposes. It should in no way be considered an endorsement of any specific treatment, therapy or action. **

## 2020-07-27 NOTE — [facility] Suicide Risk Assessment (Signed)
[facility] INPATIENT:  Family/Significant Other Suicide Prevention Education  Suicide Prevention Education:  Patient Refusal for Family/Significant Other Suicide Prevention Education: The patient Cheryl Cook has refused to provide written consent for family/significant other to be provided Family/Significant Other Suicide Prevention Education during admission and/or prior to discharge.  Physician notified.  CSW went over SPE with patient and provided a brochure   Susa Simmonds 07/27/2020, 5:13 PM
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
## 2020-07-27 NOTE — BH Assessment (Signed)
Patient is to be admitted to [facility] by Dr. Toni Amend.  Attending Physician will be Dr. Neale Burly.   Patient has been assigned to room 306, by [facility] Charge Nurse Fredderick Phenix.   ER staff is aware of the admission:  French Ana, ER Secretary    Dr. Katrinka Blazing, ER MD   Kathlen Mody, Patient's Nurse

## 2020-07-27 NOTE — Tx Team (Signed)
Initial Treatment Plan 07/27/2020 6:12 PM [name] FMB:[id number]    PATIENT STRESSORS: Substance abuse Traumatic event Other: Signs/Symptoms of SCPT, feels paranoid   PATIENT STRENGTHS: Ability for insight Communication skills Motivation for treatment/growth Supportive family/friends   PATIENT IDENTIFIED PROBLEMS: Paranoia/symptom of untreated SCPT  Depression                   DISCHARGE CRITERIA:  Adequate post-discharge living arrangements Improved stabilization in mood, thinking, and/or behavior Motivation to continue treatment in a less acute level of care Verbal commitment to aftercare and medication compliance  PRELIMINARY DISCHARGE PLAN: Attend aftercare/continuing care group Return to previous living arrangement  PATIENT/FAMILY INVOLVEMENT: This treatment plan has been presented to and reviewed with the patient, Cheryl Cook.  The patient and family have been given the opportunity to ask questions and make suggestions.  Ginger Carne, RN 07/27/2020, 6:12 PM

## 2020-07-28 DIAGNOSIS — F200 Paranoid schizophrenia: Secondary | ICD-10-CM | POA: Diagnosis not present

## 2020-07-28 MED ORDER — CITALOPRAM HYDROBROMIDE 20 MG PO TABS
40.0000 mg | ORAL_TABLET | Freq: Every day | ORAL | Status: DC
  Administered 2020-07-29 – 2020-08-01 (×4): 40 mg via ORAL
  Filled 2020-07-28 (×4): qty 2

## 2020-07-28 NOTE — Plan of Care (Signed)
D- Patient alert and oriented. Patient presents in a pleasant mood on assessment stating that she slept good last night. Patient had complaints of constipation and a headache, rating her headache a "9/10", in which she requested PRN medication from this Clinical research associate. Patient endorsed both depression/anxiety, stating that "I really want to be straightened out". Patient denies SI, HI, AVH at this time. Patient had no stated goals for today.  A- Scheduled medications administered to patient, per MD orders. Support and encouragement provided.  Routine safety checks conducted every 15 minutes.  Patient informed to notify staff with problems or concerns.  R- No adverse drug reactions noted. Patient contracts for safety at this time. Patient compliant with medications. Patient receptive, calm, and cooperative. Patient remains safe at this time.  Problem: Education: Goal: Utilization of techniques to improve thought processes will improve Outcome: Progressing Goal: Knowledge of the prescribed therapeutic regimen will improve Outcome: Progressing   Problem: Activity: Goal: Interest or engagement in leisure activities will improve Outcome: Progressing Goal: Imbalance in normal sleep/wake cycle will improve Outcome: Progressing   Problem: Coping: Goal: Coping ability will improve Outcome: Progressing Goal: Will verbalize feelings Outcome: Progressing   Problem: Health Behavior/Discharge Planning: Goal: Ability to make decisions will improve Outcome: Progressing Goal: Compliance with therapeutic regimen will improve Outcome: Progressing   Problem: Role Relationship: Goal: Will demonstrate positive changes in social behaviors and relationships Outcome: Progressing   Problem: Safety: Goal: Ability to disclose and discuss suicidal ideas will improve Outcome: Progressing Goal: Ability to identify and utilize support systems that promote safety will improve Outcome: Progressing   Problem:  Self-Concept: Goal: Will verbalize positive feelings about self Outcome: Progressing Goal: Level of anxiety will decrease Outcome: Progressing   Problem: Education: Goal: Knowledge of [facility] Education information/materials will improve Outcome: Progressing Goal: Emotional status will improve Outcome: Progressing Goal: Mental status will improve Outcome: Progressing Goal: Verbalization of understanding the information provided will improve Outcome: Progressing   Problem: Activity: Goal: Interest or engagement in activities will improve Outcome: Progressing Goal: Sleeping patterns will improve Outcome: Progressing   Problem: Coping: Goal: Ability to verbalize frustrations and anger appropriately will improve Outcome: Progressing Goal: Ability to demonstrate self-control will improve Outcome: Progressing   Problem: Health Behavior/Discharge Planning: Goal: Identification of resources available to assist in meeting health care needs will improve Outcome: Progressing Goal: Compliance with treatment plan for underlying cause of condition will improve Outcome: Progressing   Problem: Physical Regulation: Goal: Ability to maintain clinical measurements within normal limits will improve Outcome: Progressing   Problem: Safety: Goal: Periods of time without injury will increase Outcome: Progressing   Problem: Education: Goal: Ability to make informed decisions regarding treatment will improve Outcome: Progressing   Problem: Coping: Goal: Coping ability will improve Outcome: Progressing   Problem: Health Behavior/Discharge Planning: Goal: Identification of resources available to assist in meeting health care needs will improve Outcome: Progressing   Problem: Medication: Goal: Compliance with prescribed medication regimen will improve Outcome: Progressing   Problem: Self-Concept: Goal: Ability to disclose and discuss suicidal ideas will improve Outcome:  Progressing Goal: Will verbalize positive feelings about self Outcome: Progressing

## 2020-07-28 NOTE — Plan of Care (Signed)
  Problem: Safety: Goal: Periods of time without injury will increase Outcome: Progressing   Problem: Education: Goal: Ability to make informed decisions regarding treatment will improve Outcome: Progressing

## 2020-07-28 NOTE — Plan of Care (Signed)
  Problem: Education: Goal: Utilization of techniques to improve thought processes will improve Outcome: Progressing Goal: Knowledge of the prescribed therapeutic regimen will improve Outcome: Progressing   Problem: Activity: Goal: Interest or engagement in leisure activities will improve Outcome: Progressing Goal: Imbalance in normal sleep/wake cycle will improve Outcome: Progressing   Problem: Coping: Goal: Coping ability will improve Outcome: Progressing Goal: Will verbalize feelings Outcome: Progressing   Problem: Health Behavior/Discharge Planning: Goal: Ability to make decisions will improve Outcome: Progressing Goal: Compliance with therapeutic regimen will improve Outcome: Progressing   Problem: Role Relationship: Goal: Will demonstrate positive changes in social behaviors and relationships Outcome: Progressing   Problem: Safety: Goal: Ability to disclose and discuss suicidal ideas will improve Outcome: Progressing Goal: Ability to identify and utilize support systems that promote safety will improve Outcome: Progressing   Problem: Self-Concept: Goal: Will verbalize positive feelings about self Outcome: Progressing Goal: Level of anxiety will decrease Outcome: Progressing   Problem: Education: Goal: Knowledge of [facility] Education information/materials will improve Outcome: Progressing Goal: Emotional status will improve Outcome: Progressing Goal: Mental status will improve Outcome: Progressing Goal: Verbalization of understanding the information provided will improve Outcome: Progressing   Problem: Activity: Goal: Interest or engagement in activities will improve Outcome: Progressing Goal: Sleeping patterns will improve Outcome: Progressing   Problem: Coping: Goal: Ability to verbalize frustrations and anger appropriately will improve Outcome: Progressing Goal: Ability to demonstrate self-control will improve Outcome: Progressing    Problem: Health Behavior/Discharge Planning: Goal: Identification of resources available to assist in meeting health care needs will improve Outcome: Progressing Goal: Compliance with treatment plan for underlying cause of condition will improve Outcome: Progressing   Problem: Physical Regulation: Goal: Ability to maintain clinical measurements within normal limits will improve Outcome: Progressing   Problem: Safety: Goal: Periods of time without injury will increase Outcome: Progressing   Problem: Education: Goal: Ability to make informed decisions regarding treatment will improve Outcome: Progressing   Problem: Coping: Goal: Coping ability will improve Outcome: Progressing   Problem: Health Behavior/Discharge Planning: Goal: Identification of resources available to assist in meeting health care needs will improve Outcome: Progressing   Problem: Medication: Goal: Compliance with prescribed medication regimen will improve Outcome: Progressing   Problem: Self-Concept: Goal: Ability to disclose and discuss suicidal ideas will improve Outcome: Progressing Goal: Will verbalize positive feelings about self Outcome: Progressing   

## 2020-07-28 NOTE — Progress Notes (Signed)
[facility]  07/28/2020 11:17 AM Cheryl LedererMary J Cook  MRN:  [id number] Subjective:   Good" Patient with history of schizophrenia, cannabis use disorder admitted after overdose on pill.  Per report, she took her entire bottle of "sleeping pills"  Trazodone,  she was hoping she would die. Patient is seen today for follow-up.  Chart reviewed discussed with the nursing staff.  Patient continued to be withdrawn isolative ,but has been cooperative.  Patient has minimal interaction with peers . patient was lying in the bed endorses depression but denies suicidal intent or plan, patient reports hearing voices " people laughing at me" last evening.   Principal Problem: <principal problem not specified> Diagnosis: Active Problems:   Schizophrenia, paranoid ([name])  Total Time spent with patient: 25 min  Past Psychiatric History: No new information  Past Medical History:  Past Medical History:  Diagnosis Date  . Schizophrenia ([name])    History reviewed. No pertinent surgical history. Family History:  Family History  Problem Relation Age of Onset  . Arthritis Mother   . Asthma Daughter   . Asthma Son   . Diabetes Maternal Grandmother   . Hypertension Maternal Grandmother   . Glaucoma Maternal Grandmother   . Diabetes Maternal Grandfather   . Hypertension Maternal Grandfather   . Glaucoma Maternal Grandfather   . Aneurysm Maternal Grandfather   . Congestive Heart Failure Maternal Grandfather   . Asthma Half-Sister    Family Psychiatric  History: No new information Social History:  Social History   Substance and Sexual Activity  Alcohol Use No     Social History   Substance and Sexual Activity  Drug Use No    Social History   Socioeconomic History  . Marital status: Single    Spouse name: Not on file  . Number of children: Not on file  . Years of education: Not on file  . Highest education level: Not on file  Occupational History  . Not on file  Tobacco Use  . Smoking  status: Current Some Day Smoker    Packs/day: 0.50    Types: Cigarettes  . Smokeless tobacco: Never Used  Vaping Use  . Vaping Use: Never used  Substance and Sexual Activity  . Alcohol use: No  . Drug use: No  . Sexual activity: Yes    Birth control/protection: None  Other Topics Concern  . Not on file  Social History Narrative  . Not on file   Social Determinants of Health   Financial Resource Strain:   . Difficulty of Paying Living Expenses: Not on file  Food Insecurity:   . Worried About Programme researcher, broadcasting/film/videounning Out of Food in the Last Year: Not on file  . Ran Out of Food in the Last Year: Not on file  Transportation Needs:   . Lack of Transportation (Medical): Not on file  . Lack of Transportation (Non-Medical): Not on file  Physical Activity:   . Days of Exercise per Week: Not on file  . Minutes of Exercise per Session: Not on file  Stress:   . Feeling of Stress : Not on file  Social Connections:   . Frequency of Communication with Friends and Family: Not on file  . Frequency of Social Gatherings with Friends and Family: Not on file  . Attends Religious Services: Not on file  . Active Member of Clubs or Organizations: Not on file  . Attends BankerClub or Organization Meetings: Not on file  . Marital Status: Not on file  Additional Social History:                         Sleep: Fair  Appetite:  Fair  Current Medications: Current Facility-Administered Medications  Medication Dose Route Frequency Provider Last Rate Last Admin  . acetaminophen (TYLENOL) tablet 650 mg  650 mg Oral Q6H PRN Clapacs, Jackquline Denmark, MD   650 mg at 07/28/20 0859  . alum & mag hydroxide-simeth (MAALOX/MYLANTA) 200-200-20 MG/5ML suspension 30 mL  30 mL Oral Q4H PRN Clapacs, [name], MD      . citalopram (CELEXA) tablet 20 mg  20 mg Oral QPC breakfast Beverly Sessions, MD   20 mg at 07/28/20 0859  . hydrOXYzine (ATARAX/VISTARIL) tablet 25 mg  25 mg Oral TID PRN Clapacs, Jackquline Denmark, MD   25 mg at 07/28/20 0859   . magnesium hydroxide (MILK OF MAGNESIA) suspension 30 mL  30 mL Oral Daily PRN Clapacs, Jackquline Denmark, MD   30 mL at 07/28/20 0859  . melatonin tablet 2.5 mg  2.5 mg Oral QHS Clapacs, [name], MD   2.5 mg at 07/27/20 2109  . nicotine (NICODERM CQ - dosed in mg/24 hours) patch 21 mg  21 mg Transdermal Daily Clapacs, Jackquline Denmark, MD   21 mg at 07/28/20 0859  . OLANZapine (ZYPREXA) tablet 10 mg  10 mg Oral QHS Clapacs, Jackquline Denmark, MD   10 mg at 07/27/20 2109  . pantoprazole (PROTONIX) EC tablet 40 mg  40 mg Oral Daily Clapacs, Jackquline Denmark, MD   40 mg at 07/28/20 0859  . potassium chloride SA (KLOR-CON) CR tablet 40 mEq  40 mEq Oral BID Clapacs, Jackquline Denmark, MD   40 mEq at 07/28/20 0814  . zolpidem (AMBIEN) tablet 5 mg  5 mg Oral QHS PRN Beverly Sessions, MD   5 mg at 07/27/20 2110    Lab Results:  Results for orders placed or performed during the hospital encounter of 07/26/20 (from the past 48 hour(s))  Urine Drug Screen, Qualitative     Status: Abnormal   Collection Time: 07/26/20  4:46 PM  Result Value Ref Range   Tricyclic, Ur Screen NONE DETECTED NONE DETECTED   Amphetamines, Ur Screen NONE DETECTED NONE DETECTED   MDMA (Ecstasy)Ur Screen NONE DETECTED NONE DETECTED   Cocaine Metabolite,Ur [place] NONE DETECTED NONE DETECTED   Opiate, Ur Screen NONE DETECTED NONE DETECTED   Phencyclidine (PCP) Ur S NONE DETECTED NONE DETECTED   Cannabinoid 50 Ng, Ur  POSITIVE (A) NONE DETECTED   Barbiturates, Ur Screen NONE DETECTED NONE DETECTED   Benzodiazepine, Ur Scrn NONE DETECTED NONE DETECTED   Methadone Scn, Ur NONE DETECTED NONE DETECTED    Comment: (NOTE) Tricyclics + metabolites, urine    Cutoff 1000 ng/mL Amphetamines + metabolites, urine  Cutoff 1000 ng/mL MDMA (Ecstasy), urine              Cutoff 500 ng/mL Cocaine Metabolite, urine          Cutoff 300 ng/mL Opiate + metabolites, urine        Cutoff 300 ng/mL Phencyclidine (PCP), urine         Cutoff 25 ng/mL Cannabinoid, urine                 Cutoff 50  ng/mL Barbiturates + metabolites, urine  Cutoff 200 ng/mL Benzodiazepine, urine              Cutoff 200 ng/mL Methadone, urine  Cutoff 300 ng/mL  The urine drug screen provides only a preliminary, unconfirmed analytical test result and should not be used for non-medical purposes. Clinical consideration and professional judgment should be applied to any positive drug screen result due to possible interfering substances. A more specific alternate chemical method must be used in order to obtain a confirmed analytical result. Gas chromatography / mass spectrometry (GC/MS) is the preferred confirm atory method. Performed at [facility], [address] [place], [place] [address]   Resp Panel by RT-PCR (Flu A&B, Covid) Nasopharyngeal Swab     Status: None   Collection Time: 07/26/20  4:46 PM   Specimen: Nasopharyngeal Swab; Nasopharyngeal(NP) swabs in vial transport medium  Result Value Ref Range   SARS Coronavirus 2 by RT PCR NEGATIVE NEGATIVE    Comment: (NOTE) SARS-CoV-2 target nucleic acids are NOT DETECTED.  The SARS-CoV-2 RNA is generally detectable in upper respiratory specimens during the acute phase of infection. The lowest concentration of SARS-CoV-2 viral copies this assay can detect is 138 copies/mL. A negative result does not preclude SARS-Cov-2 infection and should not be used as the sole basis for treatment or other patient management decisions. A negative result may occur with  improper specimen collection/handling, submission of specimen other than nasopharyngeal swab, presence of viral mutation(s) within the areas targeted by this assay, and inadequate number of viral copies(<138 copies/mL). A negative result must be combined with clinical observations, patient history, and epidemiological information. The expected result is Negative.  Fact Sheet for Patients:  BloggerCourse.com  Fact Sheet for Healthcare  Providers:  SeriousBroker.it  This test is no t yet approved or cleared by the [place] FDA and  has been authorized for detection and/or diagnosis of SARS-CoV-2 by FDA under an Emergency Use Authorization (EUA). This EUA will remain  in effect (meaning this test can be used) for the duration of the COVID-19 declaration under Section 564(b)(1) of the Act, 21 U.S.C.section 360bbb-3(b)(1), unless the authorization is terminated  or revoked sooner.       Influenza A by PCR NEGATIVE NEGATIVE   Influenza B by PCR NEGATIVE NEGATIVE    Comment: (NOTE) The Xpert Xpress SARS-CoV-2/FLU/RSV plus assay is intended as an aid in the diagnosis of influenza from Nasopharyngeal swab specimens and should not be used as a sole basis for treatment. Nasal washings and aspirates are unacceptable for Xpert Xpress SARS-CoV-2/FLU/RSV testing.  Fact Sheet for Patients: BloggerCourse.com  Fact Sheet for Healthcare Providers: SeriousBroker.it  This test is not yet approved or cleared by the [place] FDA and has been authorized for detection and/or diagnosis of SARS-CoV-2 by FDA under an Emergency Use Authorization (EUA). This EUA will remain in effect (meaning this test can be used) for the duration of the COVID-19 declaration under Section 564(b)(1) of the Act, 21 U.S.C. section 360bbb-3(b)(1), unless the authorization is terminated or revoked.  Performed at [facility], [address] [place], [place] [address]   POC urine preg, ED     Status: None   Collection Time: 07/26/20  4:52 PM  Result Value Ref Range   Preg Test, Ur NEGATIVE NEGATIVE    Comment:        THE SENSITIVITY OF THIS METHODOLOGY IS >24 mIU/mL   Basic metabolic panel     Status: Abnormal   Collection Time: 07/27/20  4:00 AM  Result Value Ref Range   Sodium 140 135 - 145 mmol/L   Potassium 3.3 (L) 3.5 - 5.1 mmol/L   Chloride  105 98  - 111 mmol/L   CO2 27 22 - 32 mmol/L   Glucose, Bld 118 (H) 70 - 99 mg/dL    Comment: Glucose reference range applies only to samples taken after fasting for at least 8 hours.   BUN 7 6 - 20 mg/dL   Creatinine, Ser 6.78 0.44 - 1.00 mg/dL   Calcium 8.7 (L) 8.9 - 10.3 mg/dL   GFR, Estimated >93 >81 mL/min    Comment: (NOTE) Calculated using the CKD-EPI Creatinine Equation (2021)    Anion gap 8 5 - 15    Comment: Performed at [facility], [address] [place], [place] [address]    Blood Alcohol level:  Lab Results  Component Value Date   [facility] <10 07/26/2020   ETH <10 02/01/2020    Metabolic Disorder Labs: Lab Results  Component Value Date   HGBA1C 5.6 07/26/2020   MPG 114.02 07/26/2020   MPG 125.5 02/03/2020   No results found for: PROLACTIN Lab Results  Component Value Date   CHOL 149 07/26/2020   TRIG 87 07/26/2020   HDL 39 (L) 07/26/2020   CHOLHDL 3.8 07/26/2020   VLDL 17 07/26/2020   LDLCALC 93 07/26/2020   LDLCALC 80 02/06/2019    Physical Findings: AIMS:  , ,  ,  ,    CIWA:    COWS:     Musculoskeletal: Strength & Muscle Tone: within normal limits Gait & Station: normal Patient leans:   Psychiatric Specialty Exam: Physical Exam HENT:     Head: Normocephalic and atraumatic.  Eyes:     Extraocular Movements: Extraocular movements intact.     Conjunctiva/sclera: Conjunctivae normal.  Cardiovascular:     Pulses: Normal pulses.  Pulmonary:     Effort: Pulmonary effort is normal.  Abdominal:     General: Abdomen is flat.  Musculoskeletal:     Cervical back: Normal range of motion.  Neurological:     Mental Status: She is alert.     Review of Systems  Blood pressure 113/77, pulse 87, temperature 98.9 F (37.2 C), temperature source Oral, resp. rate 16, height 5\' 4"  (1.626 m), weight 84.8 kg, SpO2 96 %.Body mass index is 32.1 kg/m.  General Appearance:Casual  Eye Contact:poor  Speech:Slow  Volume:Decreased  Mood:Depressed  and Dysphoric  Affect:Congruent and Constricted  Thought Process:Disorganized  Orientation:Full (Time, Place, and Person)  Thought Content:Paranoid Ideation, Rumination and Tangential, AH of  people are talking and laughing at her  Suicidal Thoughts:Yes.with intent/plan  Homicidal Thoughts:No, denies  Memory:Immediate;Fair Recent;Poor Remote;Poor  Judgement:Impaired  Insight:Shallow  Psychomotor Activity:Decreased  Concentration:Concentration:Fair  Recall:Fair  Fund of Knowledge:Fair  Language:Fair  Akathisia:No  Handed:Right  AIMS (if indicated):   Assets:Desire for Improvement Housing Resilience Social Support  ADL's:Intact  Cognition:Impaired,Mild  Sleep: 7.75        Treatment Plan Summary: Daily contact with patient to assess and evaluate symptoms and progress in treatment and Medication management.  Patient with history of mood disorder and psychosis , schizophrenia , PTSD presenting with Worsening mood and serious suicidal attempt with overdose on trazodone. Plan- Patient continues to be psychotic hallucinating, guarded, depressed.   Increasing Celexa for depression and anxiety, will keep it in the morning. Continue Zyprexa 10mg  qhs for mood and psychosis., [phone] Holding trazodone for now, will try melatonin and Ambien for sleep. Group and milieu treatment. Will address medical issues. IVC status  , MD 07/28/2020, 11:17 AM

## 2020-07-28 NOTE — Progress Notes (Signed)
Patient is alert and oriented. She cooperative and easy to engage. She is med compliant and tolerated her medicationss without incident.  She denies SI  HI  AVH and pain at this encounter.  She does endorse anxiety and depression, but reports her levels have decreased.  She was receptive to education regarding  Melatonin vs Ambien. She is active and engages well with others on the unit.  She is safe with 15 minute safety checks and was encouraged to contatc staff with any concerns.     Cleo Butler-Nicholson, LPN

## 2020-07-28 NOTE — [facility] Group Notes (Signed)
[facility] Group Notes: (Clinical Social Work)   07/28/2020      Type of Therapy:  Group Therapy   Participation Level:  Did Not Attend - was invited individually by Nurse/MHT and chose not to attend.   Susa Simmonds, LCSWA 07/28/2020  2:54 PM

## 2020-07-28 NOTE — Progress Notes (Signed)
Patient presents with sad flat affect. Visible in milieu at times but minimal interaction with peers. Endorses anxiety and depression but denies SI at this time. Pt given prn for sleep with good relief. Encouragement and support provided. Safety checks maintained. Medications given as prescribed. Pt receptive and remains safe on unit with q 15 min checks.

## 2020-07-29 DIAGNOSIS — F251 Schizoaffective disorder, depressive type: Principal | ICD-10-CM

## 2020-07-29 NOTE — Progress Notes (Addendum)
Patient is pleasant and easy to engage. She is alert and oriented  X4. She denies SI HI AVH and pain at this encounter.  She does endorse anxiety and depression and states symptoms are stemming form her need to get back home to her mother and children. She is has been active on the unit watching TV and playing cards with other patients.  She is medication compliant and tolerated her medication without incident. She remains safe on the unit and was encouraged to come to staff with any issues.    Cleo Butler-Nicholson, LPN

## 2020-07-29 NOTE — Progress Notes (Signed)
[facility] MD Progress Note  07/29/2020 3:34 PM Cheryl Cook  MRN:  [id number] Subjective:   "I'm okay" Patient with history of schizophrenia, depression, PTSD, cannabis use disorder admitted after overdose on Trazodone.  Per report, she took her entire bottle of Trazodone hoping she would die.  Patient is seen today during treatment team and again one-on-one for follow-up.  Chart reviewed discussed with the nursing staff.  She states her goals for today are getting better and understanding her diagnosis. She states that prior to admission she was having difficulty sleeping, difficulty eating, and feeling hopeless, helpless, and worthless. She notes became overwhelmed to the point she tried to end her life. She now feels grateful that she was unsuccessful, and cites her children ages 11, 61, 78, 50, and 7 as her reason for wanting to live. She feels the holidays are a tough time of year because it makes her think of her grandparents who are no longer alive. She currently lives with her mother and five children, and feels they are supportive of her. Today she notes that she is feeling embarrassed about her suicide attempt, and feels she hears voices laughing at her. She questions if anyone believes her since her stomach was not pumped. Time spent walking through her lab work up, poison control recommendations, current diagnosis, and current treatment plan. Patient expresses gratitude for explanation. She currently denies visual hallucinations, homicidal ideations, and suicidal ideations. She continues to endorse depression and auditory hallucinations of voices degrading her.   Principal Problem: Schizoaffective disorder, depressive type ([name]) Diagnosis: Principal Problem:   Schizoaffective disorder, depressive type ([name]) Active Problems:   Marijuana abuse   History of posttraumatic stress disorder (PTSD)   Overdose of trazodone  Total Time spent with patient: 30 min  Past Psychiatric History: No new  information  Past Medical History:  Past Medical History:  Diagnosis Date  . Schizophrenia ([name])    History reviewed. No pertinent surgical history. Family History:  Family History  Problem Relation Age of Onset  . Arthritis Mother   . Asthma Daughter   . Asthma Son   . Diabetes Maternal Grandmother   . Hypertension Maternal Grandmother   . Glaucoma Maternal Grandmother   . Diabetes Maternal Grandfather   . Hypertension Maternal Grandfather   . Glaucoma Maternal Grandfather   . Aneurysm Maternal Grandfather   . Congestive Heart Failure Maternal Grandfather   . Asthma Half-Sister    Family Psychiatric  History: No new information Social History:  Social History   Substance and Sexual Activity  Alcohol Use No     Social History   Substance and Sexual Activity  Drug Use No    Social History   Socioeconomic History  . Marital status: Single    Spouse name: Not on file  . Number of children: Not on file  . Years of education: Not on file  . Highest education level: Not on file  Occupational History  . Not on file  Tobacco Use  . Smoking status: Current Some Day Smoker    Packs/day: 0.50    Types: Cigarettes  . Smokeless tobacco: Never Used  Vaping Use  . Vaping Use: Never used  Substance and Sexual Activity  . Alcohol use: No  . Drug use: No  . Sexual activity: Yes    Birth control/protection: None  Other Topics Concern  . Not on file  Social History Narrative  . Not on file   Social Determinants of Health   Financial Resource  Strain:   . Difficulty of Paying Living Expenses: Not on file  Food Insecurity:   . Worried About Programme researcher, broadcasting/film/video in the Last Year: Not on file  . Ran Out of Food in the Last Year: Not on file  Transportation Needs:   . Lack of Transportation (Medical): Not on file  . Lack of Transportation (Non-Medical): Not on file  Physical Activity:   . Days of Exercise per Week: Not on file  . Minutes of Exercise per Session: Not on  file  Stress:   . Feeling of Stress : Not on file  Social Connections:   . Frequency of Communication with Friends and Family: Not on file  . Frequency of Social Gatherings with Friends and Family: Not on file  . Attends Religious Services: Not on file  . Active Member of Clubs or Organizations: Not on file  . Attends Banker Meetings: Not on file  . Marital Status: Not on file   Additional Social History:                         Sleep: Fair  Appetite:  Fair  Current Medications: Current Facility-Administered Medications  Medication Dose Route Frequency Provider Last Rate Last Admin  . acetaminophen (TYLENOL) tablet 650 mg  650 mg Oral Q6H PRN Clapacs, Jackquline Denmark, MD   650 mg at 07/28/20 0859  . alum & mag hydroxide-simeth (MAALOX/MYLANTA) 200-200-20 MG/5ML suspension 30 mL  30 mL Oral Q4H PRN Clapacs, [name], MD      . citalopram (CELEXA) tablet 40 mg  40 mg Oral QPC breakfast Beverly Sessions, MD   40 mg at 07/29/20 0800  . hydrOXYzine (ATARAX/VISTARIL) tablet 25 mg  25 mg Oral TID PRN Clapacs, Jackquline Denmark, MD   25 mg at 07/28/20 0859  . magnesium hydroxide (MILK OF MAGNESIA) suspension 30 mL  30 mL Oral Daily PRN Clapacs, Jackquline Denmark, MD   30 mL at 07/28/20 0859  . melatonin tablet 2.5 mg  2.5 mg Oral QHS Clapacs, [name], MD   2.5 mg at 07/28/20 2128  . nicotine (NICODERM CQ - dosed in mg/24 hours) patch 21 mg  21 mg Transdermal Daily Clapacs, [name], MD   21 mg at 07/29/20 0800  . OLANZapine (ZYPREXA) tablet 10 mg  10 mg Oral QHS Clapacs, Jackquline Denmark, MD   10 mg at 07/28/20 2128  . pantoprazole (PROTONIX) EC tablet 40 mg  40 mg Oral Daily Clapacs, [name], MD   40 mg at 07/29/20 0800  . potassium chloride SA (KLOR-CON) CR tablet 40 mEq  40 mEq Oral BID Clapacs, [name], MD   40 mEq at 07/29/20 0800  . zolpidem (AMBIEN) tablet 5 mg  5 mg Oral QHS PRN Beverly Sessions, MD   5 mg at 07/28/20 2130    Lab Results:  No results found for this or any previous visit (from the past  48 hour(s)).  Blood Alcohol level:  Lab Results  Component Value Date   ETH <10 07/26/2020   ETH <10 02/01/2020    Metabolic Disorder Labs: Lab Results  Component Value Date   HGBA1C 5.6 07/26/2020   MPG 114.02 07/26/2020   MPG 125.5 02/03/2020   No results found for: PROLACTIN Lab Results  Component Value Date   CHOL 149 07/26/2020   TRIG 87 07/26/2020   HDL 39 (L) 07/26/2020   CHOLHDL 3.8 07/26/2020   VLDL 17 07/26/2020  LDLCALC 93 07/26/2020   LDLCALC 80 02/06/2019    Physical Findings: AIMS:  , ,  ,  ,    CIWA:    COWS:     Musculoskeletal: Strength & Muscle Tone: within normal limits Gait & Station: normal Patient leans:   Psychiatric Specialty Exam: Physical Exam HENT:     Head: Normocephalic and atraumatic.  Eyes:     Extraocular Movements: Extraocular movements intact.     Conjunctiva/sclera: Conjunctivae normal.  Cardiovascular:     Pulses: Normal pulses.  Pulmonary:     Effort: Pulmonary effort is normal.  Abdominal:     General: Abdomen is flat.     Palpations: Abdomen is soft.  Musculoskeletal:        General: No swelling. Normal range of motion.     Cervical back: Normal range of motion.  Skin:    General: Skin is warm and dry.  Neurological:     General: No focal deficit present.     Mental Status: She is alert and oriented to person, place, and time.  Psychiatric:        Attention and Perception: She perceives auditory hallucinations.        Mood and Affect: Mood is anxious. Affect is flat.        Speech: Speech is delayed.        Behavior: Behavior is withdrawn.        Thought Content: Thought content is paranoid. Thought content does not include homicidal or suicidal ideation.        Cognition and Memory: Cognition and memory normal.        Judgment: Judgment is impulsive.     Review of Systems  Constitutional: Positive for appetite change and fatigue.  HENT: Negative for rhinorrhea and sore throat.   Eyes: Negative for  photophobia and visual disturbance.  Respiratory: Negative for cough and shortness of breath.   Cardiovascular: Negative for chest pain and palpitations.  Gastrointestinal: Negative for constipation, diarrhea, nausea and vomiting.  Endocrine: Negative for cold intolerance and heat intolerance.  Genitourinary: Negative for difficulty urinating and dysuria.  Musculoskeletal: Negative for arthralgias and myalgias.  Skin: Negative for rash and wound.  Allergic/Immunologic: Negative for environmental allergies and food allergies.  Neurological: Negative for dizziness and headaches.  Hematological: Negative for adenopathy. Does not bruise/bleed easily.  Psychiatric/Behavioral: Positive for dysphoric mood, hallucinations and sleep disturbance. Negative for suicidal ideas.    Blood pressure 105/80, pulse 91, temperature 98.6 F (37 C), temperature source Oral, resp. rate 17, height 5\' 4"  (1.626 m), weight 84.8 kg, SpO2 96 %.Body mass index is 32.1 kg/m.  General Appearance:Casual  Eye Contact:Fair  Speech:Slow  Volume:Normal  Mood:Depressed and Dysphoric  Affect:Congruent and Constricted  Thought Process:Goal directed  Orientation:Full (Time, Place, and Person)  Thought Content:Paranoid Ideation, AH of  people are talking and laughing at her  Suicidal Thoughts:No  Homicidal Thoughts:No, denies  Memory:Immediate;Fair Recent;Fair Remote;Fair  Judgement:Impaired  Insight:Shallow  Psychomotor Activity:Decreased  Concentration:Concentration:Fair  Recall:Fair  Fund of Knowledge:Fair  Language:Fair  Akathisia:No  Handed:Right  AIMS (if indicated):   Assets:Desire for Improvement Housing Resilience Social Support  ADL's:Intact  Cognition:Impaired,Mild  Sleep: 7.75        Treatment Plan Summary: Daily contact with patient to assess and evaluate symptoms and progress in treatment and Medication management.  Patient  with schizoaffective disorder, depressed type, PTSD presenting with Worsening mood and serious suicidal attempt with overdose on trazodone. Plan- Patient continues to be psychotic hallucinating, guarded, depressed.  Continue Celexa 40 mg  for depression and anxiety Continue Zyprexa 10mg  qhs for mood and psychosis., [id number] Continue melatonin and Ambien for sleep. Group and milieu treatment. Will address medical issues. IVC status  , MD 07/29/2020, 3:34 PM

## 2020-07-29 NOTE — Tx Team (Addendum)
Interdisciplinary Treatment and Diagnostic Plan Update  07/29/2020 Time of Session: 9:00 AM Cheryl Cook MRN: [id number]  Principal Diagnosis: <principal problem not specified>  Secondary Diagnoses: Active Problems:   Schizophrenia, paranoid ([place])   Current Medications:  Current Facility-Administered Medications  Medication Dose Route Frequency Provider Last Rate Last Admin  . acetaminophen (TYLENOL) tablet 650 mg  650 mg Oral Q6H PRN Clapacs, Cheryl Reno, MD   650 mg at 07/28/20 0859  . alum & mag hydroxide-simeth (MAALOX/MYLANTA) 200-200-20 MG/5ML suspension 30 mL  30 mL Oral Q4H PRN Clapacs, Cheryl T, MD      . citalopram (CELEXA) tablet 40 mg  40 mg Oral QPC breakfast Cheryl Chancellor, MD   40 mg at 07/29/20 0800  . hydrOXYzine (ATARAX/VISTARIL) tablet 25 mg  25 mg Oral TID PRN Clapacs, Cheryl Reno, MD   25 mg at 07/28/20 0859  . magnesium hydroxide (MILK OF MAGNESIA) suspension 30 mL  30 mL Oral Daily PRN Clapacs, Cheryl Reno, MD   30 mL at 07/28/20 0859  . melatonin tablet 2.5 mg  2.5 mg Oral QHS Clapacs, Cheryl T, MD   2.5 mg at 07/28/20 2128  . nicotine (NICODERM CQ - dosed in mg/24 hours) patch 21 mg  21 mg Transdermal Daily Clapacs, Cheryl T, MD   21 mg at 07/29/20 0800  . OLANZapine (ZYPREXA) tablet 10 mg  10 mg Oral QHS Clapacs, Cheryl Reno, MD   10 mg at 07/28/20 2128  . pantoprazole (PROTONIX) EC tablet 40 mg  40 mg Oral Daily Clapacs, Cheryl T, MD   40 mg at 07/29/20 0800  . potassium chloride SA (KLOR-CON) CR tablet 40 mEq  40 mEq Oral BID Clapacs, Cheryl T, MD   40 mEq at 07/29/20 0800  . zolpidem (AMBIEN) tablet 5 mg  5 mg Oral QHS PRN Cheryl Chancellor, MD   5 mg at 07/28/20 2130   PTA Medications: Facility-Administered Medications Prior to Admission  Medication Dose Route Frequency Provider Last Rate Last Admin  . medroxyPROGESTERone (DEPO-PROVERA) injection 150 mg  150 mg Intramuscular Q90 days Caren Macadam, MD   150 mg at 02/13/20 1022   Medications Prior to Admission  Medication  Sig Dispense Refill Last Dose  . citalopram (CELEXA) 20 MG tablet Take 1 tablet (20 mg total) by mouth daily. 30 tablet 1   . OLANZapine (ZYPREXA) 10 MG tablet Take 1 tablet (10 mg total) by mouth at bedtime. 30 tablet 1   . pantoprazole (PROTONIX) 40 MG tablet Take 1 tablet (40 mg total) by mouth daily. 30 tablet 1   . traZODone (DESYREL) 50 MG tablet Take 1 tablet (50 mg total) by mouth at bedtime as needed for sleep. 30 tablet 1     Patient Stressors: Substance abuse Traumatic event Other: Signs/Symptoms of SCPT, feels paranoid  Patient Strengths: Ability for insight Communication skills Motivation for treatment/growth Supportive family/friends  Treatment Modalities: Medication Management, Group therapy, Case management,  1 to 1 session with clinician, Psychoeducation, Recreational therapy.   Physician Treatment Plan for Primary Diagnosis: <principal problem not specified> Long Term Goal(s):     Short Term Goals:    Medication Management: Evaluate patient's response, side effects, and tolerance of medication regimen.  Therapeutic Interventions: 1 to 1 sessions, Unit Group sessions and Medication administration.  Evaluation of Outcomes: Not Met  Physician Treatment Plan for Secondary Diagnosis: Active Problems:   Schizophrenia, paranoid ([place])  Long Term Goal(s):     Short Term Goals:  Medication Management: Evaluate patient's response, side effects, and tolerance of medication regimen.  Therapeutic Interventions: 1 to 1 sessions, Unit Group sessions and Medication administration.  Evaluation of Outcomes: Not Met   RN Treatment Plan for Primary Diagnosis: <principal problem not specified> Long Term Goal(s): Knowledge of disease and therapeutic regimen to maintain health will improve  Short Term Goals: Ability to demonstrate self-control, Ability to participate in decision making will improve, Ability to verbalize feelings will improve, Ability to disclose and  discuss suicidal ideas, Ability to identify and develop effective coping behaviors will improve and Compliance with prescribed medications will improve  Medication Management: RN will administer medications as ordered by provider, will assess and evaluate patient's response and provide education to patient for prescribed medication. RN will report any adverse and/or side effects to prescribing provider.  Therapeutic Interventions: 1 on 1 counseling sessions, Psychoeducation, Medication administration, Evaluate responses to treatment, Monitor vital signs and CBGs as ordered, Perform/monitor CIWA, COWS, AIMS and Fall Risk screenings as ordered, Perform wound care treatments as ordered.  Evaluation of Outcomes: Not Met   LCSW Treatment Plan for Primary Diagnosis: <principal problem not specified> Long Term Goal(s): Safe transition to appropriate next level of care at discharge, Engage patient in therapeutic group addressing interpersonal concerns.  Short Term Goals: Engage patient in aftercare planning with referrals and resources, Increase social support, Increase ability to appropriately verbalize feelings, Increase emotional regulation, Facilitate acceptance of mental health diagnosis and concerns and Increase skills for wellness and recovery  Therapeutic Interventions: Assess for all discharge needs, 1 to 1 time with Social worker, Explore available resources and support systems, Assess for adequacy in community support network, Educate family and significant other(s) on suicide prevention, Complete Psychosocial Assessment, Interpersonal group therapy.  Evaluation of Outcomes: Not Met   Progress in Treatment: Attending groups: No. Participating in groups: No. Taking medication as prescribed: Yes. Toleration medication: Yes. Family/Significant other contact made: No, will contact:  once permission is given. Patient understands diagnosis: Yes. Discussing patient identified problems/goals  with staff: Yes. Medical problems stabilized or resolved: Yes. Denies suicidal/homicidal ideation: Yes. Issues/concerns per patient self-inventory: No. Other: none  New problem(s) identified: No, Describe:  none  New Short Term/Long Term Goal(s): medication management for mood stabilization; elimination of SI thoughts; development of comprehensive mental wellness/sobriety plan.  Patient Goals:  "get myself together"  Discharge Plan or Barriers: CSW will assist patient in developing appropriate discharge plans.   Reason for Continuation of Hospitalization: Anxiety Depression Medication stabilization Suicidal ideation  Estimated Length of Stay:  1-7 days  Recreational Therapy: Patient Stressors: N/A Patient Goal: Patient will engage in groups without prompting or encouragement from LRT x3 group sessions within 5 recreation therapy group sessions.  Attendees: Patient: Cheryl Cook 07/29/2020 9:56 AM  Physician: Dr. Domingo Cocking, MD 07/29/2020 9:56 AM  Nursing: Lyda Kalata, RN 07/29/2020 9:56 AM  RN Care Manager: 07/29/2020 9:56 AM  Social Worker: Assunta Curtis, LCSW 07/29/2020 9:56 AM  Recreational Therapist: Devin Going, LRT 07/29/2020 9:56 AM  Other: Gwynneth Munson" [place], LCSW 07/29/2020 9:56 AM  Other: Paulla Dolly, MSW, Richrd Sox 07/29/2020 9:56 AM  Other: 07/29/2020 9:56 AM    Scribe for Treatment Team: Rozann Lesches, LCSW 07/29/2020 9:56 AM

## 2020-07-29 NOTE — Plan of Care (Signed)
D- Patient alert and oriented. Patient presents in a pleasant mood on assessment stating that she slept "pretty good" last night and had no complaints to voice to this Clinical research associate. Patient endorsed both depression and anxiety, stating "I'd just like to know when I'm going home". Patient denies SI, HI, AVH, and pain at this time. Patient's goal for today is "getting myself together", in which "talking", will help her achieve her goal. Patient also stated, during treatment team, that she would like to figure out her diagnosis and the different symptoms.  A- Scheduled medications administered to patient, per MD orders. Support and encouragement provided.  Routine safety checks conducted every 15 minutes.  Patient informed to notify staff with problems or concerns.  R- No adverse drug reactions noted. Patient contracts for safety at this time. Patient compliant with medications and treatment plan. Patient receptive, calm, and cooperative. Patient interacts well with others on the unit.  Patient remains safe at this time.  Problem: Education: Goal: Utilization of techniques to improve thought processes will improve Outcome: Progressing Goal: Knowledge of the prescribed therapeutic regimen will improve Outcome: Progressing   Problem: Activity: Goal: Interest or engagement in leisure activities will improve Outcome: Progressing Goal: Imbalance in normal sleep/wake cycle will improve Outcome: Progressing   Problem: Coping: Goal: Coping ability will improve Outcome: Progressing Goal: Will verbalize feelings Outcome: Progressing   Problem: Health Behavior/Discharge Planning: Goal: Ability to make decisions will improve Outcome: Progressing Goal: Compliance with therapeutic regimen will improve Outcome: Progressing   Problem: Role Relationship: Goal: Will demonstrate positive changes in social behaviors and relationships Outcome: Progressing   Problem: Safety: Goal: Ability to disclose and discuss  suicidal ideas will improve Outcome: Progressing Goal: Ability to identify and utilize support systems that promote safety will improve Outcome: Progressing   Problem: Self-Concept: Goal: Will verbalize positive feelings about self Outcome: Progressing Goal: Level of anxiety will decrease Outcome: Progressing   Problem: Education: Goal: Knowledge of [facility] Education information/materials will improve Outcome: Progressing Goal: Emotional status will improve Outcome: Progressing Goal: Mental status will improve Outcome: Progressing Goal: Verbalization of understanding the information provided will improve Outcome: Progressing   Problem: Activity: Goal: Interest or engagement in activities will improve Outcome: Progressing Goal: Sleeping patterns will improve Outcome: Progressing   Problem: Coping: Goal: Ability to verbalize frustrations and anger appropriately will improve Outcome: Progressing Goal: Ability to demonstrate self-control will improve Outcome: Progressing   Problem: Health Behavior/Discharge Planning: Goal: Identification of resources available to assist in meeting health care needs will improve Outcome: Progressing Goal: Compliance with treatment plan for underlying cause of condition will improve Outcome: Progressing   Problem: Physical Regulation: Goal: Ability to maintain clinical measurements within normal limits will improve Outcome: Progressing   Problem: Safety: Goal: Periods of time without injury will increase Outcome: Progressing   Problem: Education: Goal: Ability to make informed decisions regarding treatment will improve Outcome: Progressing   Problem: Coping: Goal: Coping ability will improve Outcome: Progressing   Problem: Health Behavior/Discharge Planning: Goal: Identification of resources available to assist in meeting health care needs will improve Outcome: Progressing   Problem: Medication: Goal: Compliance with  prescribed medication regimen will improve Outcome: Progressing   Problem: Self-Concept: Goal: Ability to disclose and discuss suicidal ideas will improve Outcome: Progressing Goal: Will verbalize positive feelings about self Outcome: Progressing

## 2020-07-29 NOTE — [facility] Group Notes (Signed)
LCSW Group Therapy Note   07/29/2020 2:29 PM  Type of Therapy and Topic:  Group Therapy:  Overcoming Obstacles   Participation Level:  Active   Description of Group:    In this group patients will be encouraged to explore what they see as obstacles to their own wellness and recovery. They will be guided to discuss their thoughts, feelings, and behaviors related to these obstacles. The group will process together ways to cope with barriers, with attention given to specific choices patients can make. Each patient will be challenged to identify changes they are motivated to make in order to overcome their obstacles. This group will be process-oriented, with patients participating in exploration of their own experiences as well as giving and receiving support and challenge from other group members.   Therapeutic Goals: 1. Patient will identify personal and current obstacles as they relate to admission. 2. Patient will identify barriers that currently interfere with their wellness or overcoming obstacles.  3. Patient will identify feelings, thought process and behaviors related to these barriers. 4. Patient will identify two changes they are willing to make to overcome these obstacles:      Summary of Patient Progress Pt was present in the group and able to identify anger as an obstacle related to her admission. Pt was able to process dealing with confrontation. Peers gave feedback and pt seemed receptive of the information given. She was also able to speak to how stigma presents as a barrier to overcoming obstacles.      Therapeutic Modalities:   Cognitive Behavioral Therapy Solution Focused Therapy Motivational Interviewing Relapse Prevention Therapy  Cheryl Cook R. Algis Greenhouse, MSW, LCSW, LCAS 07/29/2020 2:29 PM

## 2020-07-29 NOTE — Progress Notes (Signed)
Patient has been more present in the milieu today. She went outside to the courtyard, and was observed talking with peers. Patient's mood is much brighter and she stayed outside majority of the time, without any issues.

## 2020-07-29 NOTE — Progress Notes (Signed)
Recreation Therapy Notes   Date: 07/29/2020  Time: 9:30 am   Location: Craft room  Behavioral response: Appropriate  Intervention Topic: Relaxation   Discussion/Intervention:  Group content today was focused on relaxation. The group defined relaxation and identified healthy ways to relax. Individuals expressed how much time they spend relaxing. Patients expressed how much their life would be if they did not make time for themselves to relax. The group stated ways they could improve their relaxation techniques in the future.  Individuals participated in the intervention "Time to Relax" where they had a chance to experience different relaxation techniques.  Clinical Observations/Feedback: Patient came to group and explained that she relaxes by staying by herself. Individual was social with peers and staff while participating in the intervention.  Yariah Selvey LRT/CTRS         Kamryn Gauthier 07/29/2020 11:13 AM

## 2020-07-30 LAB — BASIC METABOLIC PANEL
Anion gap: 8 (ref 5–15)
BUN: 10 mg/dL (ref 6–20)
CO2: 24 mmol/L (ref 22–32)
Calcium: 9.3 mg/dL (ref 8.9–10.3)
Chloride: 106 mmol/L (ref 98–111)
Creatinine, Ser: 0.86 mg/dL (ref 0.44–1.00)
GFR, Estimated: >60 mL/min (ref >60)
Glucose, Bld: 117 mg/dL — ABNORMAL HIGH (ref 70–99)
Potassium: 4.8 mmol/L (ref 3.5–5.1)
Sodium: 138 mmol/L (ref 135–145)

## 2020-07-30 NOTE — BHH Counselor (Signed)
CSW met with pt per request. CSW informed her that he had been told that she had some questions about discharge. Pt started by saying that she had been told her diagnosis but she did not have that diagnosis. CSW admitted that he was confused regarding her statements but that what he understood was that she had questions about her diagnosis. She agreed. CSW encouraged pt to speak with the psychiatrist about her diagnosis if she had further questions. Pt asked if CSW was a doctor and was informed that he was not. CSW brought the conversation back to discharge. Pt stated that she had been going to [facility] but they would not take her back because she has missed many appointments. She also admitted that these were just her thoughts about it. Pt was informed that CSW had reached out to [place] [phone]) at [facility] regarding follow up but had not heard back from her at the moment. Pt stated that she would really rather go somewhere else for aftercare treatment. Pt and CSW discussed [place]. She appeared open to this. Discharge plans discussed in detail. She shared that she plans to return home, will need assistance with transportation, and requested some clothes. CSW stated that transportation could be arranged and clothing would be sought. No other questions/concerns expressed. Contact ended without issues.   Chalmers Guest. Guerry Bruin, MSW, LCSW, [place] 07/30/2020 3:10 PM

## 2020-07-30 NOTE — BHH Counselor (Signed)
CSW attempted contact with [facility]/peer support [phone]) regarding aftercare/up-coming discharge. Contact unable to be established but HIPPA compliant voicemail left with contact information for follow through.   Vilma Meckel. Algis Greenhouse, MSW, LCSW, LCAS 07/30/2020 9:03 AM

## 2020-07-30 NOTE — Progress Notes (Signed)
D: Pt alert and oriented. Pt rates depression 2/10, hopelessness 2/10, and anxiety 5/10. Pt goal: "Getting myself together so I can go home." Pt reports energy level as normal and concentration as being good. Pt reports sleep last night as being good. Pt did receive medications for sleep and did find them helpful. Pt denies experiencing any pain at this time. Pt denies experiencing any SI/HI, or AVH at this time. Pt's mood/affect presences as anxious/paranoid as the day progresses. Pt has asked about medications, such as when she's taking medications, what medications she's taking, what the medications are for etc. All information explained and MD also wrote down medications pt is taking.   A: Scheduled medications administered to pt, per MD orders. Support and encouragement provided. Frequent verbal contact made. Routine safety checks conducted q15 minutes.   R: No adverse drug reactions noted. Pt verbally contracts for safety at this time. Pt complaint with medications and treatment plan. Pt interacts well with others on the unit. Pt remains safe at this time. Will continue to monitor.

## 2020-07-30 NOTE — Plan of Care (Signed)
  Problem: Education: Goal: Utilization of techniques to improve thought processes will improve Outcome: Progressing Goal: Knowledge of the prescribed therapeutic regimen will improve Outcome: Progressing   Problem: Activity: Goal: Interest or engagement in leisure activities will improve Outcome: Progressing Goal: Imbalance in normal sleep/wake cycle will improve Outcome: Progressing   Problem: Coping: Goal: Coping ability will improve Outcome: Progressing Goal: Will verbalize feelings Outcome: Progressing   Problem: Health Behavior/Discharge Planning: Goal: Ability to make decisions will improve Outcome: Progressing Goal: Compliance with therapeutic regimen will improve Outcome: Progressing   Problem: Role Relationship: Goal: Will demonstrate positive changes in social behaviors and relationships Outcome: Progressing   Problem: Safety: Goal: Ability to disclose and discuss suicidal ideas will improve Outcome: Progressing Goal: Ability to identify and utilize support systems that promote safety will improve Outcome: Progressing   Problem: Self-Concept: Goal: Will verbalize positive feelings about self Outcome: Progressing Goal: Level of anxiety will decrease Outcome: Progressing   Problem: Education: Goal: Knowledge of [place] [facility] Education information/materials will improve Outcome: Progressing Goal: Emotional status will improve Outcome: Progressing Goal: Mental status will improve Outcome: Progressing Goal: Verbalization of understanding the information provided will improve Outcome: Progressing   Problem: Activity: Goal: Interest or engagement in activities will improve Outcome: Progressing Goal: Sleeping patterns will improve Outcome: Progressing   Problem: Coping: Goal: Ability to verbalize frustrations and anger appropriately will improve Outcome: Progressing Goal: Ability to demonstrate self-control will improve Outcome: Progressing    Problem: Health Behavior/Discharge Planning: Goal: Identification of resources available to assist in meeting health care needs will improve Outcome: Progressing Goal: Compliance with treatment plan for underlying cause of condition will improve Outcome: Progressing   Problem: Physical Regulation: Goal: Ability to maintain clinical measurements within normal limits will improve Outcome: Progressing   Problem: Safety: Goal: Periods of time without injury will increase Outcome: Progressing   Problem: Education: Goal: Ability to make informed decisions regarding treatment will improve Outcome: Progressing   Problem: Coping: Goal: Coping ability will improve Outcome: Progressing   Problem: Health Behavior/Discharge Planning: Goal: Identification of resources available to assist in meeting health care needs will improve Outcome: Progressing   Problem: Medication: Goal: Compliance with prescribed medication regimen will improve Outcome: Progressing   Problem: Self-Concept: Goal: Ability to disclose and discuss suicidal ideas will improve Outcome: Progressing Goal: Will verbalize positive feelings about self Outcome: Progressing   

## 2020-07-30 NOTE — BHH Group Notes (Signed)
LCSW Group Therapy Note  07/30/2020 2:52 PM  Type of Therapy/Topic:  Group Therapy:  Feelings about Diagnosis  Participation Level:  Active   Description of Group:   This group will allow patients to explore their thoughts and feelings about diagnoses they have received. Patients will be guided to explore their level of understanding and acceptance of these diagnoses. Facilitator will encourage patients to process their thoughts and feelings about the reactions of others to their diagnosis and will guide patients in identifying ways to discuss their diagnosis with significant others in their lives. This group will be process-oriented, with patients participating in exploration of their own experiences, giving and receiving support, and processing challenge from other group members.   Therapeutic Goals: 1. Patient will demonstrate understanding of diagnosis as evidenced by identifying two or more symptoms of the disorder 2. Patient will be able to express two feelings regarding the diagnosis 3. Patient will demonstrate their ability to communicate their needs through discussion and/or role play  Summary of Patient Progress: Patient was present for the entirety of the group session. Patient was an active listener and participated in the topic of discussion. When discussing about stigma related to mental health disorders, patient discussed in vague details that people treat people differently once they find out about someone's diagnosis. Patient was guarded with personal information throughout the group session.      Therapeutic Modalities:   Cognitive Behavioral Therapy Brief Therapy Feelings Identification   Gwenevere Ghazi, MSW, [place], [place] 07/30/2020 2:52 PM

## 2020-07-30 NOTE — Progress Notes (Signed)
[facility] MD Progress Note  07/30/2020 10:11 AM Cheryl Cook  MRN:  [id number] Subjective:   "I'm feeling better" Patient with history of schizophrenia, depression, PTSD, cannabis use disorder admitted after overdose on Trazodone.  Per report, she took her entire bottle of Trazodone hoping she would die.  Patient is seen today one-on-one for follow-up.  Chart reviewed discussed with the nursing staff. Yesterday she was out and social with peers, and participating in groups. This morning she states she slept well overnight. She notes that sometimes she feels "I'm there, but not there" during the day, and this has been happening for approximately one year. She notes that she struggles to remember her medications due to poor reading skills. Walked through names of medications, doses, and indications again today.  She currently denies auditory hallucinations, visual hallucinations, homicidal ideations, and suicidal ideations. She continues to endorse depression, but feels this is starting to improve.   Principal Problem: Schizoaffective disorder, depressive type ([name]) Diagnosis: Principal Problem:   Schizoaffective disorder, depressive type ([name]) Active Problems:   Marijuana abuse   History of posttraumatic stress disorder (PTSD)   Overdose of trazodone  Total Time spent with patient: 30 min  Past Psychiatric History: No new information  Past Medical History:  Past Medical History:  Diagnosis Date  . Schizophrenia ([name])    History reviewed. No pertinent surgical history. Family History:  Family History  Problem Relation Age of Onset  . Arthritis Mother   . Asthma Daughter   . Asthma Son   . Diabetes Maternal Grandmother   . Hypertension Maternal Grandmother   . Glaucoma Maternal Grandmother   . Diabetes Maternal Grandfather   . Hypertension Maternal Grandfather   . Glaucoma Maternal Grandfather   . Aneurysm Maternal Grandfather   . Congestive Heart Failure Maternal Grandfather   .  Asthma Half-Sister    Family Psychiatric  History: No new information Social History:  Social History   Substance and Sexual Activity  Alcohol Use No     Social History   Substance and Sexual Activity  Drug Use No    Social History   Socioeconomic History  . Marital status: Single    Spouse name: Not on file  . Number of children: Not on file  . Years of education: Not on file  . Highest education level: Not on file  Occupational History  . Not on file  Tobacco Use  . Smoking status: Current Some Day Smoker    Packs/day: 0.50    Types: Cigarettes  . Smokeless tobacco: Never Used  Vaping Use  . Vaping Use: Never used  Substance and Sexual Activity  . Alcohol use: No  . Drug use: No  . Sexual activity: Yes    Birth control/protection: None  Other Topics Concern  . Not on file  Social History Narrative  . Not on file   Social Determinants of Health   Financial Resource Strain:   . Difficulty of Paying Living Expenses: Not on file  Food Insecurity:   . Worried About Programme researcher, broadcasting/film/video in the Last Year: Not on file  . Ran Out of Food in the Last Year: Not on file  Transportation Needs:   . Lack of Transportation (Medical): Not on file  . Lack of Transportation (Non-Medical): Not on file  Physical Activity:   . Days of Exercise per Week: Not on file  . Minutes of Exercise per Session: Not on file  Stress:   . Feeling of Stress :  Not on file  Social Connections:   . Frequency of Communication with Friends and Family: Not on file  . Frequency of Social Gatherings with Friends and Family: Not on file  . Attends Religious Services: Not on file  . Active Member of Clubs or Organizations: Not on file  . Attends Banker Meetings: Not on file  . Marital Status: Not on file   Additional Social History:                         Sleep: Fair  Appetite:  Fair  Current Medications: Current Facility-Administered Medications  Medication Dose  Route Frequency Provider Last Rate Last Admin  . acetaminophen (TYLENOL) tablet 650 mg  650 mg Oral Q6H PRN Clapacs, Jackquline Denmark, MD   650 mg at 07/28/20 0859  . alum & mag hydroxide-simeth (MAALOX/MYLANTA) 200-200-20 MG/5ML suspension 30 mL  30 mL Oral Q4H PRN Clapacs, [name], MD      . citalopram (CELEXA) tablet 40 mg  40 mg Oral QPC breakfast Beverly Sessions, MD   40 mg at 07/30/20 0815  . hydrOXYzine (ATARAX/VISTARIL) tablet 25 mg  25 mg Oral TID PRN Clapacs, Jackquline Denmark, MD   25 mg at 07/28/20 0859  . magnesium hydroxide (MILK OF MAGNESIA) suspension 30 mL  30 mL Oral Daily PRN Clapacs, Jackquline Denmark, MD   30 mL at 07/28/20 0859  . melatonin tablet 2.5 mg  2.5 mg Oral QHS Clapacs, [name], MD   2.5 mg at 07/29/20 2111  . nicotine (NICODERM CQ - dosed in mg/24 hours) patch 21 mg  21 mg Transdermal Daily Clapacs, Jackquline Denmark, MD   21 mg at 07/30/20 0744  . OLANZapine (ZYPREXA) tablet 10 mg  10 mg Oral QHS Clapacs, Jackquline Denmark, MD   10 mg at 07/29/20 2112  . pantoprazole (PROTONIX) EC tablet 40 mg  40 mg Oral Daily Clapacs, Jackquline Denmark, MD   40 mg at 07/30/20 0743  . potassium chloride SA (KLOR-CON) CR tablet 40 mEq  40 mEq Oral BID Clapacs, Jackquline Denmark, MD   40 mEq at 07/30/20 0743  . zolpidem (AMBIEN) tablet 5 mg  5 mg Oral QHS PRN Beverly Sessions, MD   5 mg at 07/29/20 2112    Lab Results:  Results for orders placed or performed during the hospital encounter of 07/27/20 (from the past 48 hour(s))  Basic metabolic panel     Status: Abnormal   Collection Time: 07/30/20  6:54 AM  Result Value Ref Range   Sodium 138 135 - 145 mmol/L   Potassium 4.8 3.5 - 5.1 mmol/L   Chloride 106 98 - 111 mmol/L   CO2 24 22 - 32 mmol/L   Glucose, Bld 117 (H) 70 - 99 mg/dL    Comment: Glucose reference range applies only to samples taken after fasting for at least 8 hours.   BUN 10 6 - 20 mg/dL   Creatinine, Ser 1.70 0.44 - 1.00 mg/dL   Calcium 9.3 8.9 - 01.7 mg/dL   GFR, Estimated >49 >44 mL/min    Comment: (NOTE) Calculated using the  CKD-EPI Creatinine Equation (2021)    Anion gap 8 5 - 15    Comment: Performed at [facility], [address], [place], [place] [address]    Blood Alcohol level:  Lab Results  Component Value Date   [facility] <10 07/26/2020   ETH <10 02/01/2020    Metabolic Disorder Labs: Lab Results  Component Value Date   HGBA1C 5.6 07/26/2020   MPG 114.02 07/26/2020   MPG 125.5 02/03/2020   No results found for: PROLACTIN Lab Results  Component Value Date   CHOL 149 07/26/2020   TRIG 87 07/26/2020   HDL 39 (L) 07/26/2020   CHOLHDL 3.8 07/26/2020   VLDL 17 07/26/2020   LDLCALC 93 07/26/2020   LDLCALC 80 02/06/2019    Physical Findings: AIMS:  , ,  ,  ,    CIWA:    COWS:     Musculoskeletal: Strength & Muscle Tone: within normal limits Gait & Station: normal Patient leans:   Psychiatric Specialty Exam: Physical Exam HENT:     Head: Normocephalic and atraumatic.  Eyes:     Extraocular Movements: Extraocular movements intact.     Conjunctiva/sclera: Conjunctivae normal.  Cardiovascular:     Pulses: Normal pulses.  Pulmonary:     Effort: Pulmonary effort is normal.  Abdominal:     General: Abdomen is flat.     Palpations: Abdomen is soft.  Musculoskeletal:        General: No swelling. Normal range of motion.     Cervical back: Normal range of motion.  Skin:    General: Skin is warm and dry.  Neurological:     General: No focal deficit present.     Mental Status: She is alert and oriented to person, place, and time.  Psychiatric:        Attention and Perception: She does not perceive auditory hallucinations.        Mood and Affect: Mood is anxious. Affect is flat.        Speech: Speech is delayed.        Behavior: Behavior is not withdrawn. Behavior is cooperative.        Thought Content: Thought content is not paranoid. Thought content does not include homicidal or suicidal ideation.        Cognition and Memory: Cognition and memory normal.        Judgment:  Judgment is impulsive.     Review of Systems  Constitutional: Positive for fatigue. Negative for appetite change.  HENT: Negative for rhinorrhea and sore throat.   Eyes: Negative for photophobia and visual disturbance.  Respiratory: Negative for cough and shortness of breath.   Cardiovascular: Negative for chest pain and palpitations.  Gastrointestinal: Negative for constipation, diarrhea, nausea and vomiting.  Endocrine: Negative for cold intolerance and heat intolerance.  Genitourinary: Negative for difficulty urinating and dysuria.  Musculoskeletal: Negative for arthralgias and myalgias.  Skin: Negative for rash and wound.  Allergic/Immunologic: Negative for environmental allergies and food allergies.  Neurological: Negative for dizziness and headaches.  Hematological: Negative for adenopathy. Does not bruise/bleed easily.  Psychiatric/Behavioral: Positive for dysphoric mood. Negative for hallucinations, sleep disturbance and suicidal ideas.    Blood pressure (!) 106/94, pulse 95, temperature 98.2 F (36.8 C), temperature source Oral, resp. rate 17, height 5\' 4"  (1.626 m), weight 84.8 kg, SpO2 99 %.Body mass index is 32.1 kg/m.  General Appearance:Casual  Eye Contact:Fair  Speech:Slow  Volume:Normal  Mood:Depressed and Dysphoric  Affect:Congruent and Constricted  Thought Process:Goal directed  Orientation:Full (Time, Place, and Person)  Thought Content:Paranoid Ideation  Suicidal Thoughts:No  Homicidal Thoughts:No, denies  Memory:Immediate;Fair Recent;Fair Remote;Fair  Judgement:Fair  Insight:Shallow  Psychomotor Activity:Decreased  Concentration:Concentration:Fair  Recall:Fair  Fund of Knowledge:Fair  Language:Fair  Akathisia:No  Handed:Right  AIMS (if indicated):   Assets:Desire for Improvement Housing Resilience Social Support  ADL's:Intact  Cognition:Impaired,Mild  Sleep:  7.75         Treatment Plan Summary: Daily contact with patient to assess and evaluate symptoms and progress in treatment and Medication management.  Patient with schizoaffective disorder, depressed type, PTSD presenting with Worsening mood and serious suicidal attempt with overdose on trazodone. Plan- Patient continues to be psychotic hallucinating, guarded, depressed.   Continue Celexa 40 mg  for depression and anxiety Continue Zyprexa 10mg  qhs for mood and psychosis., [id number] Continue melatonin and Ambien for sleep. Group and milieu treatment. Will address medical issues. IVC status  Jesse SansMegan M Shakeria Robinette, MD 07/30/2020, 10:11 AM

## 2020-07-30 NOTE — Progress Notes (Signed)
Recreation Therapy Notes  INPATIENT RECREATION THERAPY ASSESSMENT  Patient Details Name: Cheryl Cook MRN: [id number] DOB: August 14, 1981 Today's Date: 07/30/2020       Information Obtained From: Patient  Able to Participate in Assessment/Interview: Yes  Patient Presentation: Responsive  Reason for Admission (Per Patient): Active Symptoms, Suicidal Ideation, Suicide Attempt  Patient Stressors:    Coping Skills:   Exercise, Other (Comment) (Count to ten)  Leisure Interests (2+):  Social - Family (Skating)  Frequency of Recreation/Participation: Monthly  Awareness of Community Resources:  Yes  Community Resources:  Park  Current Use:    If no, Barriers?:    Expressed Interest in [employer] Information:    [place] of Residence:  Film/video editor  Patient Main Form of Transportation: Set designer  Patient Strengths:  Care about others  Patient Identified Areas of Improvement:  Learn to speak up for myself  Patient Goal for Hospitalization:  Try to get myself together  Current SI (including self-harm):  No  Current HI:  No  Current AVH: No  Staff Intervention Plan: Group Attendance, Collaborate with Interdisciplinary Treatment Team  Consent to Intern Participation: N/A  Cheryl Cook 07/30/2020, 1:24 PM

## 2020-07-30 NOTE — Progress Notes (Signed)
Recreation Therapy Notes  Date: 07/30/2020  Time: 9:30 am   Location: Craft room  Behavioral response: Appropriate  Intervention Topic: Problem Solving    Discussion/Intervention:  Group content on today was focused on problem solving. The group described what problem solving is. Patients expressed how problems affect them and how they deal with problems. Individuals identified healthy ways to deal with problems. Patients explained what normally happens to them when they do not deal with problems. The group expressed reoccurring problems for them. The group participated in the intervention "Ways to Solve problems" where patients were given a chance to explore different ways to solve problems.  Clinical Observations/Feedback: Patient came to group and explained that she normally does not ask for help when she has a problem because she is not sure if anyone will believe her. Individual was social with peers and staff while participating in the intervention.  Ayla Dunigan LRT/CTRS          [place] Denner 07/30/2020 12:07 PM

## 2020-07-31 MED ORDER — CITALOPRAM HYDROBROMIDE 40 MG PO TABS: 40.0000 mg | ORAL_TABLET | Freq: Every day | ORAL | 1 refills | Status: DC

## 2020-07-31 MED ORDER — HYDROXYZINE HCL 25 MG PO TABS: 25.0000 mg | ORAL_TABLET | Freq: Three times a day (TID) | ORAL | 1 refills | Status: DC | PRN

## 2020-07-31 MED ORDER — ZOLPIDEM TARTRATE 5 MG PO TABS: 5.0000 mg | ORAL_TABLET | Freq: Every evening | ORAL | 0 refills | Status: DC | PRN

## 2020-07-31 MED ORDER — PANTOPRAZOLE SODIUM 40 MG PO TBEC: 40.0000 mg | DELAYED_RELEASE_TABLET | Freq: Every day | ORAL | 1 refills | Status: DC

## 2020-07-31 MED ORDER — OLANZAPINE 10 MG PO TABS: 10.0000 mg | ORAL_TABLET | Freq: Every day | ORAL | 1 refills | Status: DC

## 2020-07-31 MED ORDER — MELATONIN 5 MG PO TABS: 2.5000 mg | ORAL_TABLET | Freq: Every day | ORAL | 1 refills | Status: DC

## 2020-07-31 NOTE — Progress Notes (Addendum)
[facility]  07/31/2020 10:04 AM Cheryl Cook  MRN:  [id number] Subjective:   "I'm confused"  Patient with history of schizophrenia, depression, PTSD, cannabis use disorder admitted after overdose on Trazodone.  Per report, she took her entire bottle of Trazodone hoping she would die.  Patient is seen today one-on-one for follow-up.  Chart reviewed discussed with the nursing staff. Yesterday, she participated in groups, but exhibited confusion with social workers and staff yesterday afternoon and into this morning. In the last two hours patient has approached to ask her diagnosis, her medications, and her plan of care. She continues to have difficulty remembering I am her doctor despite numerous introductions. Patient aware that she is confused. She also endorses depression and anxiety at this time. She denies suicidal ideations, homicidal ideations, auditory hallucinations, and visual hallucinations at this time.   Spoke with Marilynne HalstedKatherine Martinez, patient's mother at [phone]. She states that she will be keeping track of patient's medications, and ensuring she takes them as directed. Reviewed current diagnosis, medications, and plan of care. She notes that Corrie DandyMary has been calling nightly to check on her children, and has become home sick. Natalia LeatherwoodKatherine feels she is starting to get back to her baseline. Discussed discharge tomorrow, and mother is in agreement with this plan as long as we can establish follow-up care with [facility] for her.    Principal Problem: Schizoaffective disorder, depressive type ([name]) Diagnosis: Principal Problem:   Schizoaffective disorder, depressive type ([name]) Active Problems:   Marijuana abuse   History of posttraumatic stress disorder (PTSD)   Overdose of trazodone  Total Time spent with patient: 30 min  Past Psychiatric History: No new information  Past Medical History:  Past Medical History:  Diagnosis Date  . Schizophrenia ([name])    History reviewed. No  pertinent surgical history. Family History:  Family History  Problem Relation Age of Onset  . Arthritis Mother   . Asthma Daughter   . Asthma Son   . Diabetes Maternal Grandmother   . Hypertension Maternal Grandmother   . Glaucoma Maternal Grandmother   . Diabetes Maternal Grandfather   . Hypertension Maternal Grandfather   . Glaucoma Maternal Grandfather   . Aneurysm Maternal Grandfather   . Congestive Heart Failure Maternal Grandfather   . Asthma Half-Sister    Family Psychiatric  History: No new information Social History:  Social History   Substance and Sexual Activity  Alcohol Use No     Social History   Substance and Sexual Activity  Drug Use No    Social History   Socioeconomic History  . Marital status: Single    Spouse name: Not on file  . Number of children: Not on file  . Years of education: Not on file  . Highest education level: Not on file  Occupational History  . Not on file  Tobacco Use  . Smoking status: Current Some Day Smoker    Packs/day: 0.50    Types: Cigarettes  . Smokeless tobacco: Never Used  Vaping Use  . Vaping Use: Never used  Substance and Sexual Activity  . Alcohol use: No  . Drug use: No  . Sexual activity: Yes    Birth control/protection: None  Other Topics Concern  . Not on file  Social History Narrative  . Not on file   Social Determinants of Health   Financial Resource Strain:   . Difficulty of Paying Living Expenses: Not on file  Food Insecurity:   . Worried About Radiation protection practitionerunning Out  of Food in the Last Year: Not on file  . Ran Out of Food in the Last Year: Not on file  Transportation Needs:   . Lack of Transportation (Medical): Not on file  . Lack of Transportation (Non-Medical): Not on file  Physical Activity:   . Days of Exercise per Week: Not on file  . Minutes of Exercise per Session: Not on file  Stress:   . Feeling of Stress : Not on file  Social Connections:   . Frequency of Communication with Friends and  Family: Not on file  . Frequency of Social Gatherings with Friends and Family: Not on file  . Attends Religious Services: Not on file  . Active Member of Clubs or Organizations: Not on file  . Attends Banker Meetings: Not on file  . Marital Status: Not on file   Additional Social History:                         Sleep: Fair  Appetite:  Fair  Current Medications: Current Facility-Administered Medications  Medication Dose Route Frequency Provider Last Rate Last Admin  . acetaminophen (TYLENOL) tablet 650 mg  650 mg Oral Q6H PRN Clapacs, Jackquline Denmark, MD   650 mg at 07/28/20 0859  . alum & mag hydroxide-simeth (MAALOX/MYLANTA) 200-200-20 MG/5ML suspension 30 mL  30 mL Oral Q4H PRN Clapacs, [name], MD      . citalopram (CELEXA) tablet 40 mg  40 mg Oral QPC breakfast Beverly Sessions, MD   40 mg at 07/31/20 0802  . hydrOXYzine (ATARAX/VISTARIL) tablet 25 mg  25 mg Oral TID PRN Clapacs, Jackquline Denmark, MD   25 mg at 07/31/20 0802  . magnesium hydroxide (MILK OF MAGNESIA) suspension 30 mL  30 mL Oral Daily PRN Clapacs, Jackquline Denmark, MD   30 mL at 07/28/20 0859  . melatonin tablet 2.5 mg  2.5 mg Oral QHS Clapacs, [name], MD   2.5 mg at 07/30/20 2104  . nicotine (NICODERM CQ - dosed in mg/24 hours) patch 21 mg  21 mg Transdermal Daily Clapacs, Jackquline Denmark, MD   21 mg at 07/31/20 0758  . OLANZapine (ZYPREXA) tablet 10 mg  10 mg Oral QHS Clapacs, Jackquline Denmark, MD   10 mg at 07/30/20 2104  . pantoprazole (PROTONIX) EC tablet 40 mg  40 mg Oral Daily Clapacs, Jackquline Denmark, MD   40 mg at 07/31/20 0756  . zolpidem (AMBIEN) tablet 5 mg  5 mg Oral QHS PRN Beverly Sessions, MD   5 mg at 07/30/20 2104    Lab Results:  Results for orders placed or performed during the hospital encounter of 07/27/20 (from the past 48 hour(s))  Basic metabolic panel     Status: Abnormal   Collection Time: 07/30/20  6:54 AM  Result Value Ref Range   Sodium 138 135 - 145 mmol/L   Potassium 4.8 3.5 - 5.1 mmol/L   Chloride 106 98 -  111 mmol/L   CO2 24 22 - 32 mmol/L   Glucose, Bld 117 (H) 70 - 99 mg/dL    Comment: Glucose reference range applies only to samples taken after fasting for at least 8 hours.   BUN 10 6 - 20 mg/dL   Creatinine, Ser 7.90 0.44 - 1.00 mg/dL   Calcium 9.3 8.9 - 24.0 mg/dL   GFR, Estimated >97 >35 mL/min    Comment: (NOTE) Calculated using the CKD-EPI Creatinine Equation (2021)    Anion gap 8  5 - 15    Comment: Performed at [facility], [address] [place], [place] [address]    Blood Alcohol level:  Lab Results  Component Value Date   [facility] <10 07/26/2020   ETH <10 02/01/2020    Metabolic Disorder Labs: Lab Results  Component Value Date   HGBA1C 5.6 07/26/2020   MPG 114.02 07/26/2020   MPG 125.5 02/03/2020   No results found for: PROLACTIN Lab Results  Component Value Date   CHOL 149 07/26/2020   TRIG 87 07/26/2020   HDL 39 (L) 07/26/2020   CHOLHDL 3.8 07/26/2020   VLDL 17 07/26/2020   LDLCALC 93 07/26/2020   LDLCALC 80 02/06/2019    Physical Findings: AIMS:  , ,  ,  ,    CIWA:    COWS:     Musculoskeletal: Strength & Muscle Tone: within normal limits Gait & Station: normal Patient leans:   Psychiatric Specialty Exam: Physical Exam HENT:     Head: Normocephalic and atraumatic.  Eyes:     Extraocular Movements: Extraocular movements intact.     Conjunctiva/sclera: Conjunctivae normal.  Cardiovascular:     Pulses: Normal pulses.  Pulmonary:     Effort: Pulmonary effort is normal.  Abdominal:     General: Abdomen is flat.     Palpations: Abdomen is soft.  Musculoskeletal:        General: No swelling. Normal range of motion.     Cervical back: Normal range of motion.  Skin:    General: Skin is warm and dry.  Neurological:     General: No focal deficit present.     Mental Status: She is alert and oriented to person, place, and time.  Psychiatric:        Attention and Perception: She does not perceive auditory hallucinations.        Mood and  Affect: Mood is anxious. Affect is flat.        Speech: Speech is delayed.        Behavior: Behavior is not withdrawn. Behavior is cooperative.        Thought Content: Thought content is paranoid. Thought content does not include homicidal or suicidal ideation.        Cognition and Memory: Cognition is impaired. Memory is impaired.        Judgment: Judgment is impulsive.     Review of Systems  Constitutional: Positive for fatigue. Negative for appetite change.  HENT: Negative for rhinorrhea and sore throat.   Eyes: Negative for photophobia and visual disturbance.  Respiratory: Negative for cough and shortness of breath.   Cardiovascular: Negative for chest pain and palpitations.  Gastrointestinal: Negative for constipation, diarrhea, nausea and vomiting.  Endocrine: Negative for cold intolerance and heat intolerance.  Genitourinary: Negative for difficulty urinating and dysuria.  Musculoskeletal: Negative for arthralgias and myalgias.  Skin: Negative for rash and wound.  Allergic/Immunologic: Negative for environmental allergies and food allergies.  Neurological: Negative for dizziness and headaches.  Hematological: Negative for adenopathy. Does not bruise/bleed easily.  Psychiatric/Behavioral: Positive for confusion and dysphoric mood. Negative for hallucinations, sleep disturbance and suicidal ideas. The patient is nervous/anxious.     Blood pressure 113/88, pulse (!) 120, temperature 98.4 F (36.9 C), temperature source Oral, resp. rate 17, height 5\' 4"  (1.626 m), weight 84.8 kg, SpO2 100 %.Body mass index is 32.1 kg/m.  General Appearance:Casual  Eye Contact:Fair  Speech:Slow  Volume:Normal  Mood:Depressed and Dysphoric  Affect:Congruent and Constricted  Thought Process:Goal directed  Orientation:Full (  Time, Place, and Person)  Thought Content:Paranoid Ideation  Suicidal Thoughts:No  Homicidal Thoughts:No, denies   Memory:Immediate;Fair Recent;Fair Remote;Fair  Judgement:Fair  Insight:Shallow  Psychomotor Activity:Decreased  Concentration: Poor  Recall:Poor  Fund of Knowledge:Fair  Language:Fair  Akathisia:No  Handed:Right  AIMS (if indicated):   Assets:Desire for Improvement Housing Resilience Social Support  ADL's:Intact  Cognition:Impaired,Mild  Sleep: 7.75        Treatment Plan Summary: Daily contact with patient to assess and evaluate symptoms and progress in treatment and Medication management.  Patient with schizoaffective disorder, depressed type, PTSD presenting with Worsening mood and serious suicidal attempt with overdose on trazodone. Plan- Patient continues to be psychotic hallucinating, guarded, depressed.   Continue Celexa 40 mg  for depression and anxiety Continue Zyprexa 10mg  qhs for mood and psychosis., [id number] Continue melatonin and Ambien for sleep. Group and milieu treatment. Will address medical issues. IVC status  , MD 07/31/2020, 10:04 AM

## 2020-07-31 NOTE — [facility] Suicide Risk Assessment (Signed)
[facility] INPATIENT:  Family/Significant Other Suicide Prevention Education  Suicide Prevention Education:  Education Completed; Michel Bickers (mother) has been identified by the patient as the family member/significant other with whom the patient will be residing, and identified as the person(s) who will aid the patient in the event of a mental health crisis (suicidal ideations/suicide attempt).  With written consent from the patient, the family member/significant other has been provided the following suicide prevention education, prior to the and/or following the discharge of the patient.  The suicide prevention education provided includes the following:  Suicide risk factors  Suicide prevention and interventions  National Suicide Hotline telephone number  [facility] assessment telephone number  [facility] Emergency Assistance 911  [facility] and/or Residential Mobile Crisis Unit telephone number  Request made of family/significant other to:  Remove weapons (e.g., guns, rifles, knives), all items previously/currently identified as safety concern.    Remove drugs/medications (over-the-counter, prescriptions, illicit drugs), all items previously/currently identified as a safety concern.  The family member/significant other verbalizes understanding of the suicide prevention education information provided.  The family member/significant other agrees to remove the items of safety concern listed above.  Patient's mother informed CSW that she will be dispensing medications to prevent accidental or purposeful overdose. Additionally, she reported that the patient is usually coherent, though, becomes confused and paranoid when she is off of her medications. Patient performs daily tasks without assistence, but does require help with finances, medication adherence, and safety planning. Patient's mother reported that she had no concerns the patient is a harm to herself or others  and reported that that had secured all sharp objects in the house. When the CSW asked if she had previously used or considered using sharp objects to harm herself, the mother reported that she had not. Mother reported that schizophrenia is prevalent in the family (2x uncles with schizophrenia who had died by suicide).   Corky Crafts 07/31/2020, 10:13 AM

## 2020-07-31 NOTE — Progress Notes (Signed)
Patient remains anxious, she was cooperative with medications. She remains depressed and anxious. Visible in the milieu interacted well with peers and staff. She seemed to sleep well through out the night.

## 2020-07-31 NOTE — Progress Notes (Signed)
Recreation Therapy Notes  Date: 07/31/2020  Time: 9:30 am   Location: Craft room  Behavioral response: Appropriate  Intervention Topic: Goal Setting   Discussion/Intervention:  Group content on today was focused on goals. Patients described what goals are and how they define goals. Individuals expressed how they go about setting goals and reaching them. The group identified how important goals are and if they make short term goals to reach long term goals. Patients described how many goals they work on at a time and what affects them not reaching their goal. Individuals described how much time they put into planning and obtaining their goals. The group participated in the intervention "My Goal Board" and made personal goal boards to help them achieve their goal. Clinical Observations/Feedback: Patient came to group and identified her goals as getting her life back together and working on her anxiety. She explained that she can work on her goals by putting her mind to what she needs to get done. Individual was social with peers and staff while participating in the intervention.  Renly Guedes LRT/CTRS          Coleby Yett 07/31/2020 11:32 AM

## 2020-07-31 NOTE — Progress Notes (Signed)
D: Pt alert and oriented. Pt rates depression 4/10, hopelessness 4/10, and anxiety 4/10. Pt goal: "Getting back home with my kids." Pt reports energy level as normal and concentration as being good. Pt reports sleep last night as being good. Pt did receive medications for sleep and did find them helpful. Pt denies pain. Pt denies experiencing any SI/HI, or AVH at this time. Pt mood/affect still appears anxious/paranoid today. Pt asked multiple times what medication she was taking and what they were for even after being told in medication room before administration. At one point pt came to the nurse's station door asking were the dayroom was and if anyone was in there. Pt has been to the dayroom multiple times since admission.   A: Scheduled medications administered to pt, per MD orders. Support and encouragement provided. Frequent verbal contact made. Routine safety checks conducted q15 minutes.   R: No adverse drug reactions noted. Pt verbally contracts for safety at this time. Pt complaint with medications and treatment plan. Pt interacts well with others on the unit. Pt remains safe at this time. Will continue to monitor.

## 2020-07-31 NOTE — [facility] Group Notes (Signed)
LCSW Group Therapy Note 07/31/2020  1:43 PM   Type of Therapy and Topic: Group Therapy: Feelings Around Returning Home & Establishing a Supportive Framework and Supporting Oneself When Supports Not Available   Participation Level: Active   Description of Group:  Patients first processed thoughts and feelings about upcoming discharge. These included fears of upcoming changes, lack of change, new living environments, judgements and expectations from others and overall stigma of mental health issues. The group then discussed the definition of a supportive framework, what that looks and feels like, and how do to discern it from an unhealthy non-supportive network. The group identified different types of supports as well as what to do when your family/friends are less than helpful or unavailable   Therapeutic Goals  1. Patient will identify one healthy supportive network that they can use at discharge. 2. Patient will identify one factor of a supportive framework and how to tell it from an unhealthy network. 3. Patient able to identify one coping skill to use when they do not have positive supports from others. 4. Patient will demonstrate ability to communicate their needs through discussion and/or role plays.   Summary of Patient Progress:  Pt attended group. CSW discussed with patient her discharge at the patient's request.  CSW reviewed with patient when she would get her clothing, belongings and scheduling aftercare appointments.  CSW also reviewed with the patient transportation needs. Patient reports that her mother will likely provide transportation and she plans to follow up with [facility] for aftercare.      Therapeutic Modalities Cognitive Behavioral Therapy Motivational Interviewing   Penni Homans, MSW, LCSW 07/31/2020 1:43 PM

## 2020-08-01 NOTE — Progress Notes (Signed)
  [facility] Adult Case Management Discharge Plan :  Will you be returning to the same living situation after discharge:  Yes,  Patient to return to place of residence At discharge, do you have transportation home?: Yes,  Patient's sister to provide transportation  Do you have the ability to pay for your medications: Yes,  [place] [employer]  Release of information consent forms completed and in the chart;  Patient's signature needed at discharge.  Patient to Follow up at:  Follow-up Information    [facility] Follow up.   Why: Zoom appointment with Tonday 08/02/2020 at 1PM.  Thanks! Contact information: [address] [place] [address] [phone]               Next level of care provider has access to [facility] Link:no  Safety Planning and Suicide Prevention discussed: Yes,  SPE completed with patient and colateral contact  Have you used any form of tobacco in the last 30 days? (Cigarettes, Smokeless Tobacco, Cigars, and/or Pipes): No  Has patient been referred to the Quitline?: Patient refused referral  Patient has been referred for addiction treatment: N/A  Corky Crafts, LCSWA 08/01/2020, 9:18 AM
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
## 2020-08-01 NOTE — Progress Notes (Signed)
Recreation Therapy Notes  INPATIENT RECREATION TR PLAN  Patient Details Name: Cheryl Cook MRN: [id number] DOB: 01-Jul-1981 Today's Date: 08/01/2020  Rec Therapy Plan Is patient appropriate for Therapeutic Recreation?: Yes Treatment times per week: At least 3 Estimated Length of Stay: 5-7 days TR Treatment/Interventions: Group participation (Comment)  Discharge Criteria Pt will be discharged from therapy if:: Discharged Treatment plan/goals/alternatives discussed and agreed upon by:: Patient/family  Discharge Summary Short term goals set: Patient will identify 3 triggers for depression within 5 recreation therapy group sessions Short term goals met: Complete Progress toward goals comments: Groups attended Which groups?: AAA/T,Goal setting,Other (Comment) (Relaxation, Problem Solving) Reason goals not met: N/A Therapeutic equipment acquired: N/A Reason patient discharged from therapy: Discharge from hospital Pt/family agrees with progress & goals achieved: Yes Date patient discharged from therapy: 08/01/20   Rune Mendez 08/01/2020, 11:56 AM

## 2020-08-01 NOTE — Progress Notes (Signed)
Recreation Therapy Notes   Date: 08/01/2020  Time: 9:30 am  Location: Craft room    Behavioral response: Appropriate   Intervention Topic: Animal Assisted Therapy   Discussion/Intervention:  Animal Assisted Therapy took place today during group.  Animal Assisted Therapy is the planned inclusion of an animal in a patient's treatment plan. The patients were able to engage in therapy with an animal during group. Participants were educated on what a service dog is and the different between a support dog and a service dog. Patient were informed on the many animal needs there are and how their needs are similar. Individuals were enlightened on the process to get a service animal or support animal. Patients got the opportunity to pet the animal and were offered emotional support from the animal and staff.   Clinical Observations/Feedback:  Patient came to group and was on topic and was focused on what peers and staff had to say. Participant shared their experiences and history with animals. She expressed that she has two dogs that she lives with. Individual was social with peers, staff and animal while participating in group.   Zamara Cozad LRT/CTRS           Tarika Mckethan 08/01/2020 11:25 AM

## 2020-08-01 NOTE — Progress Notes (Signed)
Discharge Note:  Patient denies SI/HI/AVH at this time. Discharge instructions, AVS, prescriptions, and transition record gone over with patient. Patient agrees to comply with medication management, follow-up visit, and outpatient therapy. Patient belongings returned to patient. Patient questions and concerns addressed and answered. Patient ambulatory off unit. Patient discharged to home with sister.

## 2020-08-01 NOTE — Progress Notes (Signed)
Patient remains anxious on approach, she was cooperative with treatment and with medications. She remains depressed and but she denies SI & AVH. Visible in the milieu interacted well with peers and staff. She seemed to sleep well through out the night.

## 2020-08-01 NOTE — Progress Notes (Signed)
D- Patient alert and oriented. Patient presents in a pleasant mood on assessment stating she slept ok last night and had no major complaints to voice to this writer at this time. Patient endorsed both depression and anxiety stating "I just want to go home" has her feeling this way. Patient denies SI, HI, AVH, and pain at this time. Patient's goal for today is "going home to my kids", in which she will "talk to people", in order to achieve her goal.  A- Scheduled medications administered to patient, per MD orders. Support and encouragement provided.  Routine safety checks conducted every 15 minutes.  Patient informed to notify staff with problems or concerns.  R- No adverse drug reactions noted. Patient contracts for safety at this time. Patient compliant with medications and treatment plan. Patient receptive, calm, and cooperative. Patient interacts well with others on the unit.  Patient remains safe at this time.

## 2020-08-01 NOTE — BHH Suicide Risk Assessment (Signed)
[facility] Discharge Suicide Risk Assessment   Principal Problem: Schizoaffective disorder, depressive type [facility]) Discharge Diagnoses: Principal Problem:   Schizoaffective disorder, depressive type ([name]) Active Problems:   Marijuana abuse   History of posttraumatic stress disorder (PTSD)   Overdose of trazodone   Total Time spent with patient: 30 minutes  Musculoskeletal: Strength & Muscle Tone: within normal limits Gait & Station: normal Patient leans: N/A  Psychiatric Specialty Exam: Review of Systems  Constitutional: Negative for activity change and fatigue.  HENT: Negative for rhinorrhea and sore throat.   Eyes: Negative for photophobia and visual disturbance.  Respiratory: Negative for cough and shortness of breath.   Cardiovascular: Negative for chest pain and palpitations.  Gastrointestinal: Negative for constipation, diarrhea, nausea and vomiting.  Endocrine: Negative for cold intolerance and heat intolerance.  Genitourinary: Negative for difficulty urinating and dysuria.  Musculoskeletal: Negative for arthralgias and myalgias.  Skin: Negative for rash and wound.  Allergic/Immunologic: Negative for environmental allergies and food allergies.  Neurological: Negative for dizziness and headaches.  Hematological: Negative for adenopathy. Does not bruise/bleed easily.  Psychiatric/Behavioral: Negative for behavioral problems, dysphoric mood, hallucinations and suicidal ideas.    Blood pressure 114/86, pulse 86, temperature 98.3 F (36.8 C), temperature source Oral, resp. rate 20, height 5\' 4"  (1.626 m), weight 84.8 kg, SpO2 99 %.Body mass index is 32.1 kg/m.  General Appearance: Well Groomed  ::  Good  Speech:  Clear and Coherent and Normal Rate  Volume:  Normal  Mood:  Anxious  Affect:  Congruent  Thought Process:  Coherent and Linear  Orientation:  Full (Time, Place, and Person)  Thought Content:  Logical  Suicidal Thoughts:  No  Homicidal Thoughts:  No   Memory:  Immediate;   Fair Recent;   Fair Remote;   Fair  Judgement:  Fair  Insight:  Fair  Psychomotor Activity:  Normal  Concentration:  Fair  Recall:  [phone] of Knowledge:Fair  Language: Fair  Akathisia:  Negative  Handed:  Right  AIMS (if indicated):     Assets:  Communication Skills Desire for Improvement Financial Resources/Insurance Housing Leisure Time Physical Health Resilience Social Support  Sleep:  Number of Hours: 8  Cognition: WNL  ADL's:  Intact   Mental Status Per Nursing Assessment::   On Admission:  NA  Demographic Factors:  Caucasian  Loss Factors: NA  Historical Factors: Prior suicide attempts  Risk Reduction Factors:   Responsible for children under 34 years of age, Sense of responsibility to family, Religious beliefs about death, Living with another person, especially a relative, Positive social support, Positive therapeutic relationship and Positive coping skills or problem solving skills  Continued Clinical Symptoms:  Depression:   Recent sense of peace/wellbeing Schizophrenia:   Depressive state Previous Psychiatric Diagnoses and Treatments  Cognitive Features That Contribute To Risk:  None    Suicide Risk:  Minimal: No identifiable suicidal ideation.  Patients presenting with no risk factors but with morbid ruminations; may be classified as minimal risk based on the severity of the depressive symptoms    Plan Of Care/Follow-up recommendations:  Activity:  as tolerated Diet:  regular diet  15, MD 08/01/2020, 9:09 AM

## 2020-08-01 NOTE — Plan of Care (Signed)
  Problem: Depression Goal: STG - Patient will identify 3 triggers for depression within 5 recreation therapy group sessions Description: STG - Patient will identify 3 triggers for depression within 5 recreation therapy group sessions Outcome: Completed/Met

## 2020-09-03 ENCOUNTER — Telehealth: Payer: Self-pay | Admitting: Pharmacy Technician

## 2020-09-03 NOTE — Telephone Encounter (Signed)
Patient failed to provide 2021 proof of income.  No additional medication assistance will be provided by [facility] without the required proof of income documentation.  Patient notified by letter.  Cheryl Cook Care Manager [facility]   [address] [place], [place]  [address]     This is to inform you that you are no longer eligible to receive medication assistance at [facility].  The reason(s) are:    _____Your total gross monthly household income exceeds 250% of the Federal Poverty Level.   _____Tangible assets (savings, checking, stocks/bonds, pension, retirement, etc.) exceeds our limit  _____You are eligible to receive benefits from Medicaid, [facility] or HIV Medication              Assistance Program _____You are eligible to receive benefits from a Medicare Part "D" plan _____You have prescription insurance  _____You are not an [place] resident __X__Failure to provide all requested proof of income information for 2021.    Medication assistance will resume once all requested financial information has been returned to our clinic.  If you have questions, please contact our clinic at [phone].    Thank you,  [facility] 

## 2020-09-05 ENCOUNTER — Other Ambulatory Visit: Payer: Self-pay

## 2020-09-05 ENCOUNTER — Encounter: Payer: Self-pay | Admitting: Physician Assistant

## 2020-09-05 ENCOUNTER — Ambulatory Visit (LOCAL_COMMUNITY_HEALTH_CENTER): Payer: Medicaid Other | Admitting: Physician Assistant

## 2020-09-05 VITALS — BP 102/74 | Ht 63.5 in | Wt 179.2 lb

## 2020-09-05 DIAGNOSIS — Z30013 Encounter for initial prescription of injectable contraceptive: Secondary | ICD-10-CM

## 2020-09-05 DIAGNOSIS — Z3009 Encounter for other general counseling and advice on contraception: Secondary | ICD-10-CM

## 2020-09-05 DIAGNOSIS — Z3042 Encounter for surveillance of injectable contraceptive: Secondary | ICD-10-CM

## 2020-09-05 DIAGNOSIS — A5901 Trichomonal vulvovaginitis: Secondary | ICD-10-CM

## 2020-09-05 DIAGNOSIS — Z113 Encounter for screening for infections with a predominantly sexual mode of transmission: Secondary | ICD-10-CM

## 2020-09-05 LAB — WET PREP FOR TRICH, YEAST, CLUE
Trichomonas Exam: POSITIVE — AB
Yeast Exam: NEGATIVE

## 2020-09-05 LAB — PREGNANCY, URINE: Preg Test, Ur: NEGATIVE

## 2020-09-05 MED ORDER — METRONIDAZOLE 500 MG PO TABS: 500.0000 mg | ORAL_TABLET | Freq: Two times a day (BID) | ORAL | 0 refills | Status: AC

## 2020-09-05 NOTE — Progress Notes (Addendum)
UPT is negative today. Wet mount reviewed with provider and pt received treatment for Trich with Metronidazole 500mg  (#14) 1 tab po BID x7 days. Pt also received Depo 150mg  IM today per , MD order from 11/14/2019 and per Lyndel Safe, PA order from today. Pt tolerated injection well. Counseled pt per provider orders and per standing order and pt states understanding. Reminder card of when next Depo due given to pt and pt aware to give 11/16/2019 a call around that time so she can RTC for Depo. Provider orders completed.
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
## 2020-09-13 ENCOUNTER — Encounter: Payer: Self-pay | Admitting: Student

## 2020-09-13 LAB — HM HIV SCREENING LAB: HM HIV Screening: NEGATIVE

## 2020-09-19 ENCOUNTER — Telehealth: Payer: Self-pay | Admitting: Family Medicine

## 2020-09-19 NOTE — Telephone Encounter (Signed)
Call to patient. Patient requesting TR from 09/05/2020. RN reviewed results with patient. All questions answered.   Harvie Heck, RN

## 2020-09-19 NOTE — Telephone Encounter (Signed)
Patient wants to know if all her results have came back. Wants to talk to nurse.

## 2020-10-26 ENCOUNTER — Other Ambulatory Visit: Payer: Self-pay

## 2020-10-26 ENCOUNTER — Emergency Department
Admission: EM | Admit: 2020-10-26 | Discharge: 2020-10-27 | Disposition: A | Discharge status: Other Acute Inpatient Hospital | Payer: No Typology Code available for payment source | Attending: Emergency Medicine | Admitting: Emergency Medicine

## 2020-10-26 ENCOUNTER — Encounter: Payer: Self-pay | Admitting: Intensive Care

## 2020-10-26 DIAGNOSIS — Z20822 Contact with and (suspected) exposure to covid-19: Secondary | ICD-10-CM | POA: Insufficient documentation

## 2020-10-26 DIAGNOSIS — Z046 Encounter for general psychiatric examination, requested by authority: Secondary | ICD-10-CM | POA: Diagnosis present

## 2020-10-26 DIAGNOSIS — F23 Brief psychotic disorder: Secondary | ICD-10-CM | POA: Diagnosis present

## 2020-10-26 DIAGNOSIS — R4689 Other symptoms and signs involving appearance and behavior: Secondary | ICD-10-CM

## 2020-10-26 DIAGNOSIS — F989 Unspecified behavioral and emotional disorders with onset usually occurring in childhood and adolescence: Secondary | ICD-10-CM | POA: Insufficient documentation

## 2020-10-26 DIAGNOSIS — F251 Schizoaffective disorder, depressive type: Secondary | ICD-10-CM | POA: Diagnosis not present

## 2020-10-26 DIAGNOSIS — F17210 Nicotine dependence, cigarettes, uncomplicated: Secondary | ICD-10-CM | POA: Insufficient documentation

## 2020-10-26 HISTORY — DX: Bipolar disorder, unspecified: F31.9

## 2020-10-26 LAB — CBC WITH DIFFERENTIAL/PLATELET
Abs Immature Granulocytes: 0.02 10*3/uL (ref 0.00–0.07)
Basophils Absolute: 0.1 10*3/uL (ref 0.0–0.1)
Basophils Relative: 1 %
Eosinophils Absolute: 0.4 10*3/uL (ref 0.0–0.5)
Eosinophils Relative: 4 %
HCT: 43.7 % (ref 36.0–46.0)
Hemoglobin: 14.6 g/dL (ref 12.0–15.0)
Immature Granulocytes: 0 %
Lymphocytes Relative: 34 %
Lymphs Abs: 3.1 10*3/uL (ref 0.7–4.0)
MCH: 31.3 pg (ref 26.0–34.0)
MCHC: 33.4 g/dL (ref 30.0–36.0)
MCV: 93.6 fL (ref 80.0–100.0)
Monocytes Absolute: 0.8 10*3/uL (ref 0.1–1.0)
Monocytes Relative: 9 %
Neutro Abs: 4.7 10*3/uL (ref 1.7–7.7)
Neutrophils Relative %: 52 %
Platelets: 301 10*3/uL (ref 150–400)
RBC: 4.67 MIL/uL (ref 3.87–5.11)
RDW: 13.5 % (ref 11.5–15.5)
WBC: 9.1 10*3/uL (ref 4.0–10.5)
nRBC: 0 % (ref 0.0–0.2)

## 2020-10-26 LAB — RESP PANEL BY RT-PCR (FLU A&B, COVID) ARPGX2
Influenza A by PCR: NEGATIVE
Influenza B by PCR: NEGATIVE
SARS Coronavirus 2 by RT PCR: NEGATIVE

## 2020-10-26 LAB — BASIC METABOLIC PANEL
Anion gap: 8 (ref 5–15)
BUN: 9 mg/dL (ref 6–20)
CO2: 21 mmol/L — ABNORMAL LOW (ref 22–32)
Calcium: 9.2 mg/dL (ref 8.9–10.3)
Chloride: 110 mmol/L (ref 98–111)
Creatinine, Ser: 0.79 mg/dL (ref 0.44–1.00)
GFR, Estimated: >60 mL/min (ref >60)
Glucose, Bld: 100 mg/dL — ABNORMAL HIGH (ref 70–99)
Potassium: 4.2 mmol/L (ref 3.5–5.1)
Sodium: 139 mmol/L (ref 135–145)

## 2020-10-26 LAB — URINE DRUG SCREEN, QUALITATIVE (ARMC ONLY)
Amphetamines, Ur Screen: NOT DETECTED
Barbiturates, Ur Screen: NOT DETECTED
Benzodiazepine, Ur Scrn: NOT DETECTED
Cannabinoid 50 Ng, Ur ~~LOC~~: NOT DETECTED
Cocaine Metabolite,Ur ~~LOC~~: NOT DETECTED
MDMA (Ecstasy)Ur Screen: NOT DETECTED
Methadone Scn, Ur: NOT DETECTED
Opiate, Ur Screen: NOT DETECTED
Phencyclidine (PCP) Ur S: NOT DETECTED
Tricyclic, Ur Screen: NOT DETECTED

## 2020-10-26 MED ORDER — CITALOPRAM HYDROBROMIDE 20 MG PO TABS: 40.0000 mg | ORAL_TABLET | Freq: Every day | ORAL | Status: DC

## 2020-10-26 MED ORDER — PANTOPRAZOLE SODIUM 40 MG PO TBEC: 40.0000 mg | DELAYED_RELEASE_TABLET | Freq: Every day | ORAL | Status: DC

## 2020-10-26 MED ORDER — HYDROXYZINE HCL 25 MG PO TABS
25.0000 mg | ORAL_TABLET | Freq: Three times a day (TID) | ORAL | Status: DC | PRN
  Administered 2020-10-26: 25 mg via ORAL
  Filled 2020-10-26: qty 1

## 2020-10-26 MED ORDER — MELATONIN 5 MG PO TABS
2.5000 mg | ORAL_TABLET | Freq: Every day | ORAL | Status: DC
  Administered 2020-10-26: 2.5 mg via ORAL
  Filled 2020-10-26: qty 1

## 2020-10-26 MED ORDER — OLANZAPINE 10 MG PO TABS
10.0000 mg | ORAL_TABLET | Freq: Every day | ORAL | Status: DC
  Administered 2020-10-26: 10 mg via ORAL
  Filled 2020-10-26: qty 1
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
## 2020-10-26 NOTE — ED Notes (Signed)
[facility] pt to [place] [facility] [place] for tomorrow morning after 9am. Report given to [employer]. Dr Roselyn Reef is accepting doctor per staff. Report to be called to [phone] tomorrow morning.
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
## 2020-10-26 NOTE — ED Triage Notes (Addendum)
Patient presents with uncle with c/o depression and anger issues. Has been seen for same before at [facility]. Admits to not taking medications as directed. Denies SI/HI. Reports mom is currently an admitted patient and has been stressful for patient at home. Reports just wanting help with getting back on medications

## 2020-10-26 NOTE — ED Notes (Signed)
Pt requesting update on plan. Notified psych can update her soon as they are currently seeing another pt in ER.

## 2020-10-26 NOTE — BH Assessment (Signed)
Patient has been accepted to [facility].  Patient assigned to [facility] Unit Accepting physician is Dr. Roselyn Reef.  Call report to [phone].  Representative was [place].   ER Staff is aware of it:  Bonita Quin, ER Secretary  Dr. York Cerise, ER MD  Alvis Lemmings, Patient's Nurse     Patient's Bed will be available tomorrow at 9 AM.

## 2020-10-26 NOTE — ED Notes (Signed)
Pt changed out into blue scrubs Pt belongings:  1 pair white socks 1 pair black slides 1 gray underwear 1 gray bra 1 gray pants 1 pink shirt 1 green sweatshirt 1 cloth mask 1 black Guess purse  1 white knife and 1 pink tazer taken from purse and given to officer

## 2020-10-26 NOTE — ED Notes (Signed)
Pt requesting her daily medications. State she takes them in the evening. EDP Paduchowski notified via secure chat as nothing currently ordered as far as home meds.

## 2020-10-26 NOTE — ED Notes (Signed)
Pt given bag of chips as requested.

## 2020-10-26 NOTE — BH Assessment (Signed)
Referral information for Psychiatric Hospitalization faxed to;   [facility] [phone]),   . [facility] ([phone]),  . [facility] [phone]),   . [facility] [phone]- [phone]),   . [facility] [phone]),   . [facility] [phone]).

## 2020-10-26 NOTE — ED Notes (Signed)
Pt given brief update. Pt irritable and doesn't understand why she needs to go to facility. Requested to speak with psych again. Called psych to talk with pt over the phone as they had just talked with pt in person 20 minutes ago. Pt talking with them now.

## 2020-10-26 NOTE — ED Notes (Signed)
IVC pending placement 

## 2020-10-27 NOTE — ED Notes (Signed)
Hourly rounding reveals patient in room. No complaints, stable, in no acute distress. Q15 minute rounds and monitoring via Security Cameras to continue. 

## 2020-10-27 NOTE — ED Notes (Signed)
IVC/pending transport to [facility] after 9AM. 

## 2020-10-27 NOTE — ED Notes (Signed)
Report given to [place], RN. 

## 2020-10-27 NOTE — ED Notes (Signed)
Pt. Transferred to [facility] from ED to room 4 after screening for contraband. Report to include Situation, Background, Assessment and Recommendations from Dawn RN. Pt. Oriented to unit including Q15 minute rounds as well as the security cameras for their protection. Patient is alert and oriented, warm and dry in no acute distress. Patient denies SI, HI, and AVH. Pt. Encouraged to let me know if needs arise.  

## 2021-03-26 ENCOUNTER — Other Ambulatory Visit: Payer: Self-pay

## 2021-03-26 ENCOUNTER — Encounter: Payer: Self-pay | Admitting: Family Medicine

## 2021-03-26 ENCOUNTER — Ambulatory Visit: Payer: Medicaid Other | Admitting: Family Medicine

## 2021-03-26 DIAGNOSIS — Z113 Encounter for screening for infections with a predominantly sexual mode of transmission: Secondary | ICD-10-CM

## 2021-03-26 DIAGNOSIS — N730 Acute parametritis and pelvic cellulitis: Secondary | ICD-10-CM

## 2021-03-26 LAB — HM HEPATITIS C SCREENING LAB: HM Hepatitis Screen: NEGATIVE

## 2021-03-26 LAB — WET PREP FOR TRICH, YEAST, CLUE
Trichomonas Exam: NEGATIVE
Yeast Exam: NEGATIVE

## 2021-03-26 LAB — HM HIV SCREENING LAB: HM HIV Screening: NEGATIVE

## 2021-03-26 MED ORDER — CEFTRIAXONE SODIUM 500 MG IJ SOLR
500.0000 mg | Freq: Once | INTRAMUSCULAR | Status: AC
  Administered 2021-03-26: 500 mg via INTRAMUSCULAR

## 2021-03-26 MED ORDER — DOXYCYCLINE HYCLATE 100 MG PO TABS: 100.0000 mg | ORAL_TABLET | Freq: Two times a day (BID) | ORAL | 0 refills | Status: AC

## 2021-03-26 MED ORDER — METRONIDAZOLE 500 MG PO TABS: 500.0000 mg | ORAL_TABLET | Freq: Two times a day (BID) | ORAL | 0 refills | Status: AC
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
## 2021-03-31 ENCOUNTER — Ambulatory Visit: Payer: Medicaid Other

## 2021-04-02 ENCOUNTER — Other Ambulatory Visit: Payer: Self-pay

## 2021-04-02 ENCOUNTER — Ambulatory Visit: Payer: Medicaid Other | Admitting: Physician Assistant

## 2021-04-02 DIAGNOSIS — Z09 Encounter for follow-up examination after completed treatment for conditions other than malignant neoplasm: Secondary | ICD-10-CM

## 2021-04-02 DIAGNOSIS — Z113 Encounter for screening for infections with a predominantly sexual mode of transmission: Secondary | ICD-10-CM | POA: Diagnosis not present

## 2021-04-02 DIAGNOSIS — N739 Female pelvic inflammatory disease, unspecified: Secondary | ICD-10-CM

## 2021-04-02 NOTE — Progress Notes (Signed)
Pt here for follow-up of PID.  Pt states that her sx's have improved, she has no additional sx's and she has 2 doses of medication to take. Berdie Ogren, RN

## 2021-04-04 ENCOUNTER — Telehealth: Payer: Self-pay | Admitting: Family Medicine

## 2021-04-04 NOTE — Telephone Encounter (Signed)
Patient called to speak to a nurse about test results. 

## 2021-04-05 NOTE — Progress Notes (Signed)
S:  Patient into clinic for PID recheck today.  States that she is feeling a lot better and is almost done with her medicines.  States that she has had a little nausea but no vomiting.  Patient asked what she was treated for and how it happens. O:  WDWN female in NAD, A&O x 3, normal work of breathing; abdomen=soft, nt, no masses or guarding; external genitalia=normal female without nits or lesions; bimanual=uterus normal size, firm, mobile, nt, no CMT, no adnexal tenderness or fullness. Lab results still pending. A/P:  1.  PID resolved with treatment. 2.  Counseled patient what PID is and how this can happen.  Counseled that is can be caused by sexually transmitted bacteria but that is not always the cause. 3.  Counseled patient to complete all medicines as directed. 4.  Cousneled patient that we will call her once her results are back if she needs to RTC for further treatment. 5.  Enc condoms with all sex. 6.  RTC prn.

## 2021-05-28 ENCOUNTER — Ambulatory Visit: Payer: Medicaid Other

## 2021-09-24 ENCOUNTER — Emergency Department
Admission: EM | Admit: 2021-09-24 | Discharge: 2021-09-24 | Disposition: A | Discharge status: Home / Self Care | Payer: Medicaid Other | Attending: Emergency Medicine | Admitting: Emergency Medicine

## 2021-09-24 ENCOUNTER — Encounter: Payer: Self-pay | Admitting: Emergency Medicine

## 2021-09-24 ENCOUNTER — Other Ambulatory Visit: Payer: Self-pay

## 2021-09-24 DIAGNOSIS — U071 COVID-19: Secondary | ICD-10-CM

## 2021-09-24 DIAGNOSIS — R059 Cough, unspecified: Secondary | ICD-10-CM | POA: Diagnosis present

## 2021-09-24 LAB — RESP PANEL BY RT-PCR (FLU A&B, COVID) ARPGX2
Influenza A by PCR: NEGATIVE
Influenza B by PCR: NEGATIVE
SARS Coronavirus 2 by RT PCR: POSITIVE — AB

## 2021-09-24 MED ORDER — PAXLOVID (300/100) 20 X 150 MG & 10 X 100MG PO TBPK: 3.0000 | ORAL_TABLET | Freq: Two times a day (BID) | ORAL | 0 refills | Status: AC

## 2021-09-24 MED ORDER — ONDANSETRON 4 MG PO TBDP: 4.0000 mg | ORAL_TABLET | Freq: Three times a day (TID) | ORAL | 0 refills | Status: DC | PRN

## 2021-09-24 MED ORDER — IBUPROFEN 200 MG PO TABS: 600.0000 mg | ORAL_TABLET | Freq: Four times a day (QID) | ORAL | 0 refills | Status: DC | PRN

## 2021-09-24 NOTE — ED Provider Notes (Signed)
[facility] Provider Note    Event Date/Time   First MD Initiated Contact with Patient 09/24/21 1136     (approximate)   History   Cough   HPI  Cheryl Cook is a 41 y.o. female   with a past history of schizoaffective disorder who comes ED complaining cough fever and congestion since today.  Took some ibuprofen and Robitussin this morning and feels improved.  No chest pain.      Physical Exam   Triage Vital Signs: ED Triage Vitals  Enc Vitals Group     BP 09/24/21 1128 108/82     Pulse Rate 09/24/21 1128 (!) 115     Resp 09/24/21 1128 20     Temp 09/24/21 1128 98.6 F (37 C)     Temp Source 09/24/21 1128 Oral     SpO2 09/24/21 1128 98 %     Weight 09/24/21 1043 169 lb 15.6 oz (77.1 kg)     Height 09/24/21 1043 5' 3.5" (1.613 m)     Head Circumference --      Peak Flow --      Pain Score 09/24/21 1042 5     Pain Loc --      Pain Edu? --      Excl. in [place]? --     Most recent vital signs: Vitals:   09/24/21 1128  BP: 108/82  Pulse: (!) 115  Resp: 20  Temp: 98.6 F (37 C)  SpO2: 98%     General: Awake, no distress.  CV:  Good peripheral perfusion.  Resp:  Normal effort.  Abd:  No distention.  Other:  Moist mucosa   ED Results / Procedures / Treatments   Labs (all labs ordered are listed, but only abnormal results are displayed) Labs Reviewed  RESP PANEL BY RT-PCR (FLU A&B, COVID) ARPGX2 - Abnormal; Notable for the following components:      Result Value   SARS Coronavirus 2 by RT PCR POSITIVE (*)    All other components within normal limits     EKG     RADIOLOGY     PROCEDURES:  Critical Care performed: No  Procedures   MEDICATIONS ORDERED IN ED: Medications - No data to display   IMPRESSION / MDM / ASSESSMENT AND PLAN / ED COURSE  I reviewed the triage vital signs and the nursing notes.                             Patient presents with symptoms of viral URI.  COVID test is positive.  Will prescribe  Paxlovid as well as ibuprofen and Zofran for supportive care.         FINAL CLINICAL IMPRESSION(S) / ED DIAGNOSES   Final diagnoses:  COVID-19 virus infection     Rx / DC Orders   ED Discharge Orders          Ordered    nirmatrelvir & ritonavir (PAXLOVID, 300/100,) 20 x 150 MG & 10 x 100MG  TBPK  2 times daily        09/24/21 1329    ibuprofen (MOTRIN IB) 200 MG tablet  Every 6 hours PRN        09/24/21 1329    ondansetron (ZOFRAN-ODT) 4 MG disintegrating tablet  Every 8 hours PRN        09/24/21 1329             Note:  This  document was prepared using Conservation officer, historic buildings and may include unintentional dictation errors.   Sharman Cheek, MD 09/24/21 [phone]

## 2021-09-24 NOTE — ED Triage Notes (Signed)
C/O painful cough, fever, sinus congestion.  Took Ibuprofen and robitussin one hour PTA

## 2021-09-24 NOTE — Discharge Instructions (Signed)
Your COVID test is positive.  Take Paxlovid as prescribed for the next 5 days to help limit your symptoms and recover faster.  Take ibuprofen as needed for pain or fever, and Zofran for nausea.  Be sure to drink lots of fluids to stay well-hydrated.

## 2021-10-07 ENCOUNTER — Ambulatory Visit: Payer: Medicaid Other

## 2021-11-04 ENCOUNTER — Ambulatory Visit: Payer: Medicaid Other

## 2021-11-26 ENCOUNTER — Ambulatory Visit: Payer: Medicaid Other

## 2022-07-13 ENCOUNTER — Emergency Department
Admission: EM | Admit: 2022-07-13 | Discharge: 2022-07-14 | Disposition: A | Discharge status: Other Acute Inpatient Hospital | Payer: 59 | Attending: Emergency Medicine | Admitting: Emergency Medicine

## 2022-07-13 ENCOUNTER — Other Ambulatory Visit: Payer: Self-pay

## 2022-07-13 ENCOUNTER — Encounter: Payer: Self-pay | Admitting: *Deleted

## 2022-07-13 DIAGNOSIS — Z139 Encounter for screening, unspecified: Secondary | ICD-10-CM

## 2022-07-13 DIAGNOSIS — F1210 Cannabis abuse, uncomplicated: Secondary | ICD-10-CM | POA: Diagnosis present

## 2022-07-13 DIAGNOSIS — F23 Brief psychotic disorder: Secondary | ICD-10-CM | POA: Diagnosis present

## 2022-07-13 DIAGNOSIS — Z62810 Personal history of physical and sexual abuse in childhood: Secondary | ICD-10-CM

## 2022-07-13 DIAGNOSIS — Z20822 Contact with and (suspected) exposure to covid-19: Secondary | ICD-10-CM | POA: Insufficient documentation

## 2022-07-13 DIAGNOSIS — T43211A Poisoning by selective serotonin and norepinephrine reuptake inhibitors, accidental (unintentional), initial encounter: Secondary | ICD-10-CM | POA: Diagnosis present

## 2022-07-13 DIAGNOSIS — F251 Schizoaffective disorder, depressive type: Secondary | ICD-10-CM | POA: Diagnosis present

## 2022-07-13 DIAGNOSIS — F3162 Bipolar disorder, current episode mixed, moderate: Secondary | ICD-10-CM

## 2022-07-13 DIAGNOSIS — E876 Hypokalemia: Secondary | ICD-10-CM | POA: Insufficient documentation

## 2022-07-13 DIAGNOSIS — F17210 Nicotine dependence, cigarettes, uncomplicated: Secondary | ICD-10-CM | POA: Insufficient documentation

## 2022-07-13 DIAGNOSIS — F419 Anxiety disorder, unspecified: Secondary | ICD-10-CM | POA: Insufficient documentation

## 2022-07-13 DIAGNOSIS — Z8659 Personal history of other mental and behavioral disorders: Secondary | ICD-10-CM

## 2022-07-13 DIAGNOSIS — Z9149 Other personal history of psychological trauma, not elsewhere classified: Secondary | ICD-10-CM | POA: Insufficient documentation

## 2022-07-13 LAB — COMPREHENSIVE METABOLIC PANEL
ALT: 14 U/L (ref 0–44)
AST: 16 U/L (ref 15–41)
Albumin: 4 g/dL (ref 3.5–5.0)
Alkaline Phosphatase: 64 U/L (ref 38–126)
Anion gap: 9 (ref 5–15)
BUN: 7 mg/dL (ref 6–20)
CO2: 20 mmol/L — ABNORMAL LOW (ref 22–32)
Calcium: 9.1 mg/dL (ref 8.9–10.3)
Chloride: 110 mmol/L (ref 98–111)
Creatinine, Ser: 0.8 mg/dL (ref 0.44–1.00)
GFR, Estimated: >60 mL/min (ref >60)
Glucose, Bld: 109 mg/dL — ABNORMAL HIGH (ref 70–99)
Potassium: 3.3 mmol/L — ABNORMAL LOW (ref 3.5–5.1)
Sodium: 139 mmol/L (ref 135–145)
Total Bilirubin: 0.6 mg/dL (ref 0.3–1.2)
Total Protein: 7.8 g/dL (ref 6.5–8.1)

## 2022-07-13 LAB — CBC
HCT: 44.1 % (ref 36.0–46.0)
Hemoglobin: 15.1 g/dL — ABNORMAL HIGH (ref 12.0–15.0)
MCH: 31.3 pg (ref 26.0–34.0)
MCHC: 34.2 g/dL (ref 30.0–36.0)
MCV: 91.5 fL (ref 80.0–100.0)
Platelets: 337 10*3/uL (ref 150–400)
RBC: 4.82 MIL/uL (ref 3.87–5.11)
RDW: 12.6 % (ref 11.5–15.5)
WBC: 16.2 10*3/uL — ABNORMAL HIGH (ref 4.0–10.5)
nRBC: 0 % (ref 0.0–0.2)

## 2022-07-13 LAB — ETHANOL: Alcohol, Ethyl (B): <10 mg/dL (ref <10)

## 2022-07-13 LAB — ACETAMINOPHEN LEVEL: Acetaminophen (Tylenol), Serum: <10 ug/mL — ABNORMAL LOW (ref 10–30)

## 2022-07-13 LAB — POC URINE PREG, ED: Preg Test, Ur: NEGATIVE

## 2022-07-13 LAB — SALICYLATE LEVEL: Salicylate Lvl: <7 mg/dL — ABNORMAL LOW (ref 7.0–30.0)

## 2022-07-13 MED ORDER — HYDROXYZINE HCL 25 MG PO TABS
50.0000 mg | ORAL_TABLET | Freq: Once | ORAL | Status: AC
  Administered 2022-07-13: 50 mg via ORAL
  Filled 2022-07-13: qty 2

## 2022-07-13 NOTE — ED Triage Notes (Signed)
Pt here for psych eval.  Son states pt has not been taking her meds.  Hx schizophrenia, bipolar.  Pt also has abd pain. No n/v/d.  Pt alert
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
## 2022-07-13 NOTE — ED Notes (Signed)
vol/pending consult.. 

## 2022-07-13 NOTE — ED Notes (Signed)
Patient is acting very confused and states she is here cause she needs blood work to sell her house. Patient continues to question how long she is going to be here cause her kids at waiting.  When I ask patient how old her children are she can't tell Clinical research associate.

## 2022-07-13 NOTE — ED Notes (Signed)
Pink socks Checkered sneakers Gray leggings Black hoodie Purple underwear Gray tee shirt Tan bra Black hair tie Pink back pack purse

## 2022-07-14 ENCOUNTER — Inpatient Hospital Stay
Admission: AD | Admit: 2022-07-14 | Discharge: 2022-07-15 | DRG: 885 | Disposition: A | Discharge status: Home / Self Care | Payer: 59 | Source: Intra-hospital | Attending: Psychiatry | Admitting: Psychiatry

## 2022-07-14 ENCOUNTER — Encounter: Payer: Self-pay | Admitting: Psychiatry

## 2022-07-14 DIAGNOSIS — F29 Unspecified psychosis not due to a substance or known physiological condition: Secondary | ICD-10-CM | POA: Insufficient documentation

## 2022-07-14 DIAGNOSIS — Z8261 Family history of arthritis: Secondary | ICD-10-CM

## 2022-07-14 DIAGNOSIS — Z833 Family history of diabetes mellitus: Secondary | ICD-10-CM

## 2022-07-14 DIAGNOSIS — F3162 Bipolar disorder, current episode mixed, moderate: Secondary | ICD-10-CM

## 2022-07-14 DIAGNOSIS — F251 Schizoaffective disorder, depressive type: Secondary | ICD-10-CM | POA: Diagnosis present

## 2022-07-14 DIAGNOSIS — F419 Anxiety disorder, unspecified: Secondary | ICD-10-CM | POA: Diagnosis present

## 2022-07-14 DIAGNOSIS — Z825 Family history of asthma and other chronic lower respiratory diseases: Secondary | ICD-10-CM

## 2022-07-14 DIAGNOSIS — F17210 Nicotine dependence, cigarettes, uncomplicated: Secondary | ICD-10-CM | POA: Diagnosis present

## 2022-07-14 DIAGNOSIS — Z1152 Encounter for screening for COVID-19: Secondary | ICD-10-CM | POA: Diagnosis not present

## 2022-07-14 DIAGNOSIS — Z8249 Family history of ischemic heart disease and other diseases of the circulatory system: Secondary | ICD-10-CM

## 2022-07-14 LAB — URINE DRUG SCREEN, QUALITATIVE (ARMC ONLY)
Amphetamines, Ur Screen: NOT DETECTED
Barbiturates, Ur Screen: NOT DETECTED
Benzodiazepine, Ur Scrn: NOT DETECTED
Cannabinoid 50 Ng, Ur ~~LOC~~: POSITIVE — AB
Cocaine Metabolite,Ur ~~LOC~~: NOT DETECTED
MDMA (Ecstasy)Ur Screen: NOT DETECTED
Methadone Scn, Ur: NOT DETECTED
Opiate, Ur Screen: NOT DETECTED
Phencyclidine (PCP) Ur S: NOT DETECTED
Tricyclic, Ur Screen: NOT DETECTED

## 2022-07-14 LAB — SARS CORONAVIRUS 2 BY RT PCR: SARS Coronavirus 2 by RT PCR: NEGATIVE

## 2022-07-14 MED ORDER — ALUM & MAG HYDROXIDE-SIMETH 200-200-20 MG/5ML PO SUSP: 30.0000 mL | ORAL | Status: DC | PRN

## 2022-07-14 MED ORDER — IBUPROFEN 600 MG PO TABS: 600.0000 mg | ORAL_TABLET | Freq: Four times a day (QID) | ORAL | Status: DC | PRN

## 2022-07-14 MED ORDER — HYDROXYZINE HCL 25 MG PO TABS: 25.0000 mg | ORAL_TABLET | Freq: Three times a day (TID) | ORAL | Status: DC | PRN

## 2022-07-14 MED ORDER — ARIPIPRAZOLE 2 MG PO TABS: 2.0000 mg | ORAL_TABLET | Freq: Every day | ORAL | Status: DC

## 2022-07-14 MED ORDER — MAGNESIUM HYDROXIDE 400 MG/5ML PO SUSP: 30.0000 mL | Freq: Every day | ORAL | Status: DC | PRN

## 2022-07-14 MED ORDER — MIRTAZAPINE 15 MG PO TABS
7.5000 mg | ORAL_TABLET | Freq: Every day | ORAL | Status: DC
  Administered 2022-07-14: 7.5 mg via ORAL
  Filled 2022-07-14: qty 1

## 2022-07-14 MED ORDER — NICOTINE 21 MG/24HR TD PT24
21.0000 mg | MEDICATED_PATCH | Freq: Every day | TRANSDERMAL | Status: DC
  Administered 2022-07-14 – 2022-07-15 (×2): 21 mg via TRANSDERMAL
  Filled 2022-07-14 (×2): qty 1

## 2022-07-14 MED ORDER — MIRTAZAPINE 15 MG PO TABS
7.5000 mg | ORAL_TABLET | Freq: Every day | ORAL | Status: DC
  Filled 2022-07-14: qty 1

## 2022-07-14 MED ORDER — ARIPIPRAZOLE 2 MG PO TABS
2.0000 mg | ORAL_TABLET | Freq: Every day | ORAL | Status: DC
  Administered 2022-07-14: 2 mg via ORAL
  Filled 2022-07-14: qty 1

## 2022-07-14 MED ORDER — ARIPIPRAZOLE 5 MG PO TABS
5.0000 mg | ORAL_TABLET | Freq: Every day | ORAL | Status: DC
  Administered 2022-07-15: 5 mg via ORAL
  Filled 2022-07-14: qty 1

## 2022-07-14 MED ORDER — IBUPROFEN 600 MG PO TABS
600.0000 mg | ORAL_TABLET | Freq: Four times a day (QID) | ORAL | Status: DC | PRN
  Administered 2022-07-14: 600 mg via ORAL
  Filled 2022-07-14: qty 1
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
## 2022-07-14 NOTE — ED Provider Notes (Signed)
Emergency Medicine Observation Re-evaluation Note  Cheryl Cook is a 41 y.o. female, seen on rounds today.  Pt initially presented to the ED for complaints of Psychiatric Evaluation Currently, the patient is resting, voices no medical complaints.  Physical Exam  BP 111/78 (BP Location: Left Arm)   Pulse 98   Temp 98.6 F (37 C) (Oral)   Resp 15   Ht 5\' 3"  (1.6 m)   Wt 77 kg   LMP 06/18/2022 (Approximate)   SpO2 98%   BMI 30.07 kg/m  Physical Exam General: Resting in no acute distress Cardiac: No cyanosis Lungs: Equal rise and fall Psych: Not agitated  ED Course / MDM  EKG:   I have reviewed the labs performed to date as well as medications administered while in observation.  Recent changes in the last 24 hours include no events overnight.  Plan  Current plan is for psychiatric disposition.    06/20/2022, MD 07/14/22 [phone]

## 2022-07-14 NOTE — BH Assessment (Signed)
Patient is to be admitted to [facility] BMU today 07/14/22 by Dr. Toni Amend.  Attending Physician will be Dr.  Toni Amend .   Patient has been assigned to room 303, by [facility] Charge Nurse Gigi.     ER staff is aware of the admission: Glenda,ER Secretary   Dr. Fuller Plan, ER MD  Pattricia Boss, Patient's Nurse  Rob, Patient Access.

## 2022-07-14 NOTE — Group Note (Signed)
[facility] LCSW Group Therapy Note   Group Date: 07/14/2022 Start Time: 1300 End Time: 1400  Type of Therapy/Topic:  Group Therapy:  Feelings about Diagnosis  Participation Level:  Did Not Attend    Description of Group:    This group will allow patients to explore their thoughts and feelings about diagnoses they have received. Patients will be guided to explore their level of understanding and acceptance of these diagnoses. Facilitator will encourage patients to process their thoughts and feelings about the reactions of others to their diagnosis, and will guide patients in identifying ways to discuss their diagnosis with significant others in their lives. This group will be process-oriented, with patients participating in exploration of their own experiences as well as giving and receiving support and challenge from other group members.   Therapeutic Goals: 1. Patient will demonstrate understanding of diagnosis as evidence by identifying two or more symptoms of the disorder:  2. Patient will be able to express two feelings regarding the diagnosis 3. Patient will demonstrate ability to communicate their needs through discussion and/or role plays  Summary of Patient Progress: Patient declined to attend group despite invitation.   Therapeutic Modalities:   Cognitive Behavioral Therapy Brief Therapy Feelings Identification    Delfina Schreurs R Massiah Longanecker, LCSW 

## 2022-07-14 NOTE — Tx Team (Signed)
Initial Treatment Plan 07/14/2022 3:45 PM Cheryl Cook [id number]    PATIENT STRESSORS: Educational concerns   Occupational concerns     PATIENT STRENGTHS: Motivation for treatment/growth  Supportive family/friends    PATIENT IDENTIFIED PROBLEMS:   Always tense  In between jobs  Unable to learn how to relax               DISCHARGE CRITERIA:  Ability to meet basic life and health needs Verbal commitment to aftercare and medication compliance  PRELIMINARY DISCHARGE PLAN: Attend aftercare/continuing care group Return to previous living arrangement  PATIENT/FAMILY INVOLVEMENT: This treatment plan has been presented to and reviewed with the patient, Cheryl Cook, and/or family member.  The patient and family have been given the opportunity to ask questions and make suggestions.  Luane School, RN 07/14/2022, 3:45 PM

## 2022-07-14 NOTE — Progress Notes (Signed)
Patient is a 41 year old female admitted IVC from [facility] ED to Behavioral Medicine with a diagnosis of psychosis at approx 1230. Per her chart, patient has a psych history of schizophrenia, bipolar, and manic depression. Patient presents as calm, cooperative, and appropriate during assessment. Patient states, "I don't know why I am here. I was just tense." Patient reports seeing a psychiatrist at [facility] and everything was going well. When asked what medications the psychiatrist prescribes, patient lists 1 medication for sleep, Albuterol, and an acid reflux pill. Patient denies psych history and is a poor historian. Patient lists moving in the near future as a stressor. Patient denies SI/HI/AVH. Patient lists 'learning how to relax' as her goal during admission. Skin assessment and body search completed with Gigi RN. Skin: 1 tattoo on lower back, 1 tattoo on both forearms. No other skin concerns to note. No contraband found. Consents signed. Patient given the opportunity to ask questions and express concerns. Unit orientation conducted and toiletries/towels provided. Lunch ordered.

## 2022-07-14 NOTE — ED Notes (Signed)
This RN attempted to call report- informed receiving nurse not on the floor to call back in 10 minutes

## 2022-07-14 NOTE — H&P (Signed)
Psychiatric Admission Assessment Adult  Patient Identification: Cheryl Cook MRN:  [id number] Date of Evaluation:  07/14/2022 Chief Complaint:  Psychosis [facility]) [F29] Principal Diagnosis: Schizoaffective disorder, depressive type ([name]) Diagnosis:  Principal Problem:   Schizoaffective disorder, depressive type ([name])  History of Present Illness: Patient seen and chart reviewed.  41 year old woman with a history of schizoaffective disorder.  Patient was brought into the hospital initially voluntarily because of family wanting her to get "checked up".  Was thought to need hospitalization.  Patient herself has no complaints.  Denies mood symptoms.  Denies psychotic symptoms.  Denies any suicidal or homicidal ideation.  Decision to admit was based on report from her son that she has been getting angry and argumentative yelling at some of the kids in the house and that he thinks that her symptoms have been worse.  Son also had reported that she had not been compliant with medicine.  Drug screen positive for cannabis.  No alcohol.  Patient herself denies all drug use.  She is followed up at [facility].  Goes every 3 months.  Cannot remember the name of her actual provider.  Current outpatient medications seem to be very low-dose Abilify at 2 mg and Remeron 7-1/2 mg Associated Signs/Symptoms: Depression Symptoms:  anxiety, Duration of Depression Symptoms: No data recorded (Hypo) Manic Symptoms:  Irritable Mood, Anxiety Symptoms:   Patient denies any Psychotic Symptoms:   Patient denies any PTSD Symptoms: Negative Total Time spent with patient: 45 minutes  Past Psychiatric History: Patient has a history of schizophrenia or bipolar disorder or schizoaffective disorder.  She has had at least 2 maybe 3 prior admissions here in the hospital under similar circumstances.  Previous medicines when she had been in the hospital were olanzapine and citalopram.  These seem to have been changed by her current  provider.  No known history of suicide attempts.  No known history of serious violence.  Has had depression with suicidal ideation in the past.  Is the patient at risk to self? No.  Has the patient been a risk to self in the past 6 months? No.  Has the patient been a risk to self within the distant past? Yes.    Is the patient a risk to others? No.  Has the patient been a risk to others in the past 6 months? No.  Has the patient been a risk to others within the distant past? No.   [place] Scale:  Flowsheet Row Admission (Current) from 07/14/2022 in [facility] INPATIENT BEHAVIORAL MEDICINE ED from 07/13/2022 in [facility] EMERGENCY DEPARTMENT ED from 09/24/2021 in [facility] EM[facility]GENCY DEPARTMENT  C-SSRS RISK CATEGORY No Risk No Risk No Risk        Prior Inpatient Therapy:   Prior Outpatient Therapy:    Alcohol Screening:   Substance Abuse History in the last 12 months:  No. Consequences of Substance Abuse: Negative Previous Psychotropic Medications: Yes  Psychological Evaluations: Yes  Past Medical History:  Past Medical History:  Diagnosis Date   Bipolar 1 disorder ([name])    Manic depression ([name])    Schizophrenia ([name])    History reviewed. No pertinent surgical history. Family History:  Family History  Problem Relation Age of Onset   Arthritis Mother    Asthma Daughter    Asthma Son    Diabetes Maternal Grandmother    Hypertension Maternal Grandmother    Glaucoma Maternal Grandmother    Diabetes Maternal Grandfather    Hypertension Maternal Grandfather  Glaucoma Maternal Grandfather    Aneurysm Maternal Grandfather    Congestive Heart Failure Maternal Grandfather    Asthma Half-Sister    Family Psychiatric  History: No reported psychiatric history in the family Tobacco Screening:   Social History:  Social History   Substance and Sexual Activity  Alcohol Use Not Currently     Social History   Substance and Sexual Activity   Drug Use Not Currently   Comment: last uded 09/2020    Additional Social History:                           Allergies:  No Known Allergies Lab Results:  Results for orders placed or performed during the hospital encounter of 07/13/22 (from the past 48 hour(s))  Comprehensive metabolic panel     Status: Abnormal   Collection Time: 07/13/22  9:11 PM  Result Value Ref Range   Sodium 139 135 - 145 mmol/L   Potassium 3.3 (L) 3.5 - 5.1 mmol/L   Chloride 110 98 - 111 mmol/L   CO2 20 (L) 22 - 32 mmol/L   Glucose, Bld 109 (H) 70 - 99 mg/dL    Comment: Glucose reference range applies only to samples taken after fasting for at least 8 hours.   BUN 7 6 - 20 mg/dL   Creatinine, Ser 1.61 0.44 - 1.00 mg/dL   Calcium 9.1 8.9 - 09.6 mg/dL   Total Protein 7.8 6.5 - 8.1 g/dL   Albumin 4.0 3.5 - 5.0 g/dL   AST 16 15 - 41 U/L   ALT 14 0 - 44 U/L   Alkaline Phosphatase 64 38 - 126 U/L   Total Bilirubin 0.6 0.3 - 1.2 mg/dL   GFR, Estimated >04 >54 mL/min    Comment: (NOTE) Calculated using the CKD-EPI Creatinine Equation (2021)    Anion gap 9 5 - 15    Comment: Performed at [facility], [address], [place], [place] [address]  Ethanol     Status: None   Collection Time: 07/13/22  9:11 PM  Result Value Ref Range   Alcohol, Ethyl (B) <10 <10 mg/dL    Comment: (NOTE) Lowest detectable limit for serum alcohol is 10 mg/dL.  For medical purposes only. Performed at [facility], [address] [place], [place] [address]   Salicylate level     Status: Abnormal   Collection Time: 07/13/22  9:11 PM  Result Value Ref Range   Salicylate Lvl <7.0 (L) 7.0 - 30.0 mg/dL    Comment: Performed at [facility], [address] [place], [place] [address]  Acetaminophen level     Status: Abnormal   Collection Time: 07/13/22  9:11 PM  Result Value Ref Range   Acetaminophen (Tylenol), Serum <10 (L) 10 - 30 ug/mL    Comment: (NOTE) Therapeutic concentrations vary  significantly. A range of 10-30 ug/mL  may be an effective concentration for many patients. However, some  are best treated at concentrations outside of this range. Acetaminophen concentrations >150 ug/mL at 4 hours after ingestion  and >50 ug/mL at 12 hours after ingestion are often associated with  toxic reactions.  Performed at [facility], [address] [place], [place] [address]   cbc     Status: Abnormal   Collection Time: 07/13/22  9:11 PM  Result Value Ref Range   WBC 16.2 (H) 4.0 - 10.5 K/uL   RBC 4.82 3.87 - 5.11 MIL/uL  Hemoglobin 15.1 (H) 12.0 - 15.0 g/dL   HCT 65.5 37.4 - 82.7 %   MCV 91.5 80.0 - 100.0 fL   MCH 31.3 26.0 - 34.0 pg   MCHC 34.2 30.0 - 36.0 g/dL   RDW 07.8 67.5 - 44.9 %   Platelets 337 150 - 400 K/uL   nRBC 0.0 0.0 - 0.2 %    Comment: Performed at [facility], [address], [place], [place] [address]  Urine Drug Screen, Qualitative     Status: Abnormal   Collection Time: 07/13/22 10:28 PM  Result Value Ref Range   Tricyclic, Ur Screen NONE DETECTED NONE DETECTED   Amphetamines, Ur Screen NONE DETECTED NONE DETECTED   MDMA (Ecstasy)Ur Screen NONE DETECTED NONE DETECTED   Cocaine Metabolite,Ur [place] NONE DETECTED NONE DETECTED   Opiate, Ur Screen NONE DETECTED NONE DETECTED   Phencyclidine (PCP) Ur S NONE DETECTED NONE DETECTED   Cannabinoid 50 Ng, Ur [place] POSITIVE (A) NONE DETECTED   Barbiturates, Ur Screen NONE DETECTED NONE DETECTED   Benzodiazepine, Ur Scrn NONE DETECTED NONE DETECTED   Methadone Scn, Ur NONE DETECTED NONE DETECTED    Comment: (NOTE) Tricyclics + metabolites, urine    Cutoff 1000 ng/mL Amphetamines + metabolites, urine  Cutoff 1000 ng/mL MDMA (Ecstasy), urine              Cutoff 500 ng/mL Cocaine Metabolite, urine          Cutoff 300 ng/mL Opiate + metabolites, urine        Cutoff 300 ng/mL Phencyclidine (PCP), urine         Cutoff 25 ng/mL Cannabinoid, urine                 Cutoff 50 ng/mL Barbiturates +  metabolites, urine  Cutoff 200 ng/mL Benzodiazepine, urine              Cutoff 200 ng/mL Methadone, urine                   Cutoff 300 ng/mL  The urine drug screen provides only a preliminary, unconfirmed analytical test result and should not be used for non-medical purposes. Clinical consideration and professional judgment should be applied to any positive drug screen result due to possible interfering substances. A more specific alternate chemical method must be used in order to obtain a confirmed analytical result. Gas chromatography / mass spectrometry (GC/MS) is the preferred confirm atory method. Performed at [facility], [address] [place], [place] [address]   POC urine preg, ED     Status: None   Collection Time: 07/13/22 10:30 PM  Result Value Ref Range   Preg Test, Ur NEGATIVE NEGATIVE    Comment:        THE SENSITIVITY OF THIS METHODOLOGY IS >24 mIU/mL   SARS Coronavirus 2 by RT PCR (hospital order, performed in [facility] hospital lab) *cepheid single result test* Anterior Nasal Swab     Status: None   Collection Time: 07/13/22 11:55 PM   Specimen: Anterior Nasal Swab  Result Value Ref Range   SARS Coronavirus 2 by RT PCR NEGATIVE NEGATIVE    Comment: (NOTE) SARS-CoV-2 target nucleic acids are NOT DETECTED.  The SARS-CoV-2 RNA is generally detectable in upper and lower respiratory specimens during the acute phase of infection. The lowest concentration of SARS-CoV-2 viral copies this assay can detect is 250 copies / mL. A negative result does not preclude SARS-CoV-2 infection and should not be used as  the sole basis for treatment or other patient management decisions.  A negative result may occur with improper specimen collection / handling, submission of specimen other than nasopharyngeal swab, presence of viral mutation(s) within the areas targeted by this assay, and inadequate number of viral copies (<250 copies / mL). A negative result must be  combined with clinical observations, patient history, and epidemiological information.  Fact Sheet for Patients:   RoadLapTop.co.za  Fact Sheet for Healthcare Providers: http://kim-miller.com/  This test is not yet approved or  cleared by the [place] FDA and has been authorized for detection and/or diagnosis of SARS-CoV-2 by FDA under an Emergency Use Authorization (EUA).  This EUA will remain in effect (meaning this test can be used) for the duration of the COVID-19 declaration under Section 564(b)(1) of the Act, 21 U.S.C. section 360bbb-3(b)(1), unless the authorization is terminated or revoked sooner.  Performed at [facility], [address] [place], [place] [address]     Blood Alcohol level:  Lab Results  Component Value Date   [facility] <10 07/13/2022   ETH <10 07/26/2020    Metabolic Disorder Labs:  Lab Results  Component Value Date   HGBA1C 5.6 07/26/2020   MPG 114.02 07/26/2020   MPG 125.5 02/03/2020   No results found for: "PROLACTIN" Lab Results  Component Value Date   CHOL 149 07/26/2020   TRIG 87 07/26/2020   HDL 39 (L) 07/26/2020   CHOLHDL 3.8 07/26/2020   VLDL 17 07/26/2020   LDLCALC 93 07/26/2020   LDLCALC 80 02/06/2019    Current Medications: Current Facility-Administered Medications  Medication Dose Route Frequency Provider Last Rate Last Admin   alum & mag hydroxide-simeth (MAALOX/MYLANTA) 200-200-20 MG/5ML suspension 30 mL  30 mL Oral Q4H PRN Vanetta Mulders, NP       [START ON 07/15/2022] ARIPiprazole (ABILIFY) tablet 5 mg  5 mg Oral Daily Pernell Lenoir, Jackquline Denmark, MD       hydrOXYzine (ATARAX) tablet 25 mg  25 mg Oral TID PRN Vanetta Mulders, NP       ibuprofen (ADVIL) tablet 600 mg  600 mg Oral Q6H PRN Vanetta Mulders, NP       magnesium hydroxide (MILK OF MAGNESIA) suspension 30 mL  30 mL Oral Daily PRN Gabriel Cirri F, NP       mirtazapine (REMERON) tablet 7.5 mg  7.5 mg Oral  QHS Gabriel Cirri F, NP       PTA Medications: Medications Prior to Admission  Medication Sig Dispense Refill Last Dose   ABILIFY 2 MG tablet Take 2 mg by mouth daily. (Patient not taking: Reported on 07/13/2022)      citalopram (CELEXA) 40 MG tablet Take 1 tablet (40 mg total) by mouth daily after breakfast. (Patient not taking: Reported on 03/26/2021) 30 tablet 1    hydrOXYzine (ATARAX) 10 MG tablet Take 10 mg by mouth every 6 (six) hours as needed. (Patient not taking: Reported on 07/13/2022)      ibuprofen (MOTRIN IB) 200 MG tablet Take 3 tablets (600 mg total) by mouth every 6 (six) hours as needed. (Patient not taking: Reported on 07/13/2022) 60 tablet 0    melatonin 5 MG TABS Take 0.5 tablets (2.5 mg total) by mouth at bedtime. (Patient not taking: Reported on 03/26/2021) 30 tablet 1    mirtazapine (REMERON) 7.5 MG tablet Take 7.5 mg by mouth at bedtime. (Patient not taking: Reported on 07/13/2022)      OLANZapine (ZYPREXA) 10 MG tablet Take 1  tablet (10 mg total) by mouth at bedtime. (Patient not taking: Reported on 03/26/2021) 30 tablet 1    ondansetron (ZOFRAN-ODT) 4 MG disintegrating tablet Take 1 tablet (4 mg total) by mouth every 8 (eight) hours as needed for nausea or vomiting. (Patient not taking: Reported on 07/13/2022) 20 tablet 0    pantoprazole (PROTONIX) 40 MG tablet Take 1 tablet (40 mg total) by mouth daily. (Patient not taking: Reported on 03/26/2021) 30 tablet 1    zolpidem (AMBIEN) 5 MG tablet Take 1 tablet (5 mg total) by mouth at bedtime as needed for sleep. (Patient not taking: Reported on 03/26/2021) 30 tablet 0     Musculoskeletal: Strength & Muscle Tone: within normal limits Gait & Station: normal Patient leans: N/A            Psychiatric Specialty Exam:  Presentation  General Appearance:  Bizarre  Eye Contact: Fleeting  Speech: Clear and Coherent  Speech Volume: Increased  Handedness: Right   Mood and Affect  Mood: Anxious; Depressed;  Irritable  Affect: Depressed; Blunt; Inappropriate   Thought Process  Thought Processes: Goal Directed  Duration of Psychotic Symptoms: No data recorded Past Diagnosis of Schizophrenia or Psychoactive disorder: Yes  Descriptions of Associations:Loose  Orientation:Full (Time, Place and Person)  Thought Content:Rumination; Tangential  Hallucinations:Hallucinations: None  Ideas of Reference:Paranoia  Suicidal Thoughts:Suicidal Thoughts: No  Homicidal Thoughts:Homicidal Thoughts: No   Sensorium  Memory: Immediate Fair; Recent Fair; Remote Fair  Judgment: Poor  Insight: Poor   Executive Functions  Concentration: Poor  Attention Span: Poor  Recall: Poor  Fund of Knowledge: Poor  Language: Good   Psychomotor Activity  Psychomotor Activity: Psychomotor Activity: Normal   Assets  Assets: Desire for Improvement; Resilience; Financial Resources/Insurance; Social Support   Sleep  Sleep: Sleep: Poor Number of Hours of Sleep: 3    Physical Exam: Physical Exam Vitals and nursing note reviewed.  Constitutional:      Appearance: Normal appearance.  HENT:     Head: Normocephalic and atraumatic.     Mouth/Throat:     Pharynx: Oropharynx is clear.  Eyes:     Pupils: Pupils are equal, round, and reactive to light.  Cardiovascular:     Rate and Rhythm: Normal rate and regular rhythm.  Pulmonary:     Effort: Pulmonary effort is normal.     Breath sounds: Normal breath sounds.  Abdominal:     General: Abdomen is flat.     Palpations: Abdomen is soft.  Musculoskeletal:        General: Normal range of motion.  Skin:    General: Skin is warm and dry.  Neurological:     General: No focal deficit present.     Mental Status: She is alert. Mental status is at baseline.  Psychiatric:        Attention and Perception: She is inattentive.        Mood and Affect: Mood normal. Affect is blunt.        Speech: Speech is tangential.        Behavior:  Behavior is cooperative.        Thought Content: Thought content normal. Thought content does not include homicidal or suicidal ideation.        Cognition and Memory: Cognition is impaired. Memory is impaired.        Judgment: Judgment is inappropriate.    Review of Systems  Constitutional: Negative.   HENT: Negative.    Eyes: Negative.   Respiratory: Negative.  Cardiovascular: Negative.   Gastrointestinal: Negative.   Musculoskeletal: Negative.   Skin: Negative.   Neurological: Negative.   Psychiatric/Behavioral: Negative.     Blood pressure 98/74, pulse 68, temperature 98.6 F (37 C), temperature source Oral, resp. rate 16, height 5\' 3"  (1.6 m), weight 75.8 kg, last menstrual period 06/18/2022, SpO2 100 %. Body mass index is 29.58 kg/m.  Treatment Plan Summary: Medication management and Plan patient is currently calm and denying any symptoms.  Does not see why she is in the hospital.  It does sound like she has been on a much lower equivalent dose of medication recently.  The complaints however from the family are of her being loud and argumentative there is no report of physical violence.  No report of any suicidal behavior.  Not clear that patient really needs hospitalization.  I did suggest to her that we increase the Abilify dose to 5 mg from the low 2 mg.  She was agreeable to doing this.  Labs will be completed.  Reevaluate tomorrow for possible discharge.  Observation Level/Precautions:  15 minute checks  Laboratory:  Chemistry Profile  Psychotherapy:    Medications:    Consultations:    Discharge Concerns:    Estimated LOS:  Other:     Physician Treatment Plan for Primary Diagnosis: Schizoaffective disorder, depressive type ([name]) Long Term Goal(s): Improvement in symptoms so as ready for discharge  Short Term Goals: Ability to verbalize feelings will improve and Ability to demonstrate self-control will improve  Physician Treatment Plan for Secondary Diagnosis:  Principal Problem:   Schizoaffective disorder, depressive type ([name])  Long Term Goal(s): Improvement in symptoms so as ready for discharge  Short Term Goals: Ability to maintain clinical measurements within normal limits will improve and Compliance with prescribed medications will improve  I certify that inpatient services furnished can reasonably be expected to improve the patient's condition.    06/20/2022, MD 11/21/20234:13 PM

## 2022-07-14 NOTE — [facility] Suicide Risk Assessment (Signed)
[facility] Admission Suicide Risk Assessment   Nursing information obtained from:  Patient, Review of record Demographic factors:  Caucasian Current Mental Status:  NA Loss Factors:  NA (denies) Historical Factors:  NA (denies) Risk Reduction Factors:  Living with another person, especially a relative  Total Time spent with patient: 45 minutes Principal Problem: Schizoaffective disorder, depressive type ([name]) Diagnosis:  Principal Problem:   Schizoaffective disorder, depressive type ([name])  Subjective Data: Patient seen and chart reviewed.  Patient denies suicidal ideation.  Denies homicidal ideation.  Denies any psychotic symptoms.  No specific new complaints.  No report of any recent suicidal behavior.  Continued Clinical Symptoms:    The "Alcohol Use Disorders Identification Test", Guidelines for Use in Primary Care, Second Edition.  World Science writer [facility]). Score between 0-7:  no or low risk or alcohol related problems. Score between 8-15:  moderate risk of alcohol related problems. Score between 16-19:  high risk of alcohol related problems. Score 20 or above:  warrants further diagnostic evaluation for alcohol dependence and treatment.   CLINICAL FACTORS:   Schizophrenia:   Paranoid or undifferentiated type   Musculoskeletal: Strength & Muscle Tone: within normal limits Gait & Station: normal Patient leans: N/A  Psychiatric Specialty Exam:  Presentation  General Appearance:  Bizarre  Eye Contact: Fleeting  Speech: Clear and Coherent  Speech Volume: Increased  Handedness: Right   Mood and Affect  Mood: Anxious; Depressed; Irritable  Affect: Depressed; Blunt; Inappropriate   Thought Process  Thought Processes: Goal Directed  Descriptions of Associations:Loose  Orientation:Full (Time, Place and Person)  Thought Content:Rumination; Tangential  History of Schizophrenia/Schizoaffective disorder:Yes  Duration of Psychotic Symptoms:No data  recorded Hallucinations:Hallucinations: None  Ideas of Reference:Paranoia  Suicidal Thoughts:Suicidal Thoughts: No  Homicidal Thoughts:Homicidal Thoughts: No   Sensorium  Memory: Immediate Fair; Recent Fair; Remote Fair  Judgment: Poor  Insight: Poor   Executive Functions  Concentration: Poor  Attention Span: Poor  Recall: Poor  Fund of Knowledge: Poor  Language: Good   Psychomotor Activity  Psychomotor Activity: Psychomotor Activity: Normal   Assets  Assets: Desire for Improvement; Resilience; Financial Resources/Insurance; Social Support   Sleep  Sleep: Sleep: Poor Number of Hours of Sleep: 3    Physical Exam: Physical Exam Vitals and nursing note reviewed.  Constitutional:      Appearance: Normal appearance.  HENT:     Head: Normocephalic and atraumatic.     Mouth/Throat:     Pharynx: Oropharynx is clear.  Eyes:     Pupils: Pupils are equal, round, and reactive to light.  Cardiovascular:     Rate and Rhythm: Normal rate and regular rhythm.  Pulmonary:     Effort: Pulmonary effort is normal.     Breath sounds: Normal breath sounds.  Abdominal:     General: Abdomen is flat.     Palpations: Abdomen is soft.  Musculoskeletal:        General: Normal range of motion.  Skin:    General: Skin is warm and dry.  Neurological:     General: No focal deficit present.     Mental Status: She is alert. Mental status is at baseline.  Psychiatric:        Attention and Perception: She is inattentive.        Mood and Affect: Mood normal. Affect is blunt.        Speech: Speech is tangential.        Behavior: Behavior is cooperative.        Thought  Content: Thought content normal.        Cognition and Memory: Cognition is impaired. Memory is impaired.    Review of Systems  Constitutional: Negative.   HENT: Negative.    Eyes: Negative.   Respiratory: Negative.    Cardiovascular: Negative.   Gastrointestinal: Negative.   Musculoskeletal:  Negative.   Skin: Negative.   Neurological: Negative.   Psychiatric/Behavioral: Negative.     Blood pressure 98/74, pulse 68, temperature 98.6 F (37 C), temperature source Oral, resp. rate 16, height 5\' 3"  (1.6 m), weight 75.8 kg, last menstrual period 06/18/2022, SpO2 100 %. Body mass index is 29.58 kg/m.   COGNITIVE FEATURES THAT CONTRIBUTE TO RISK:  Thought constriction (tunnel vision)    SUICIDE RISK:   Minimal: No identifiable suicidal ideation.  Patients presenting with no risk factors but with morbid ruminations; may be classified as minimal risk based on the severity of the depressive symptoms  PLAN OF CARE: Continue 15-minute checks.  Work on some adjustment to medication.  Complete labs.  Ongoing assessment of dangerousness prior to discharge  I certify that inpatient services furnished can reasonably be expected to improve the patient's condition.   06/20/2022, MD 07/14/2022, 4:11 PM

## 2022-07-14 NOTE — ED Notes (Signed)
IVC/recommends psych inpatient admissions when medically cleared.

## 2022-07-14 NOTE — [facility] Group Notes (Signed)
[facility] Group Notes:  (Nursing/MHT/Case Management/Adjunct)  Date:  07/14/2022  Time:  8:16 PM  Type of Therapy:   Wrap up  Participation Level:  Active  Participation Quality:  Appropriate  Affect:  Appropriate  Cognitive:  Alert  Insight:  Good  Engagement in Group:  Engaged and she said she had a good day.  Modes of Intervention:  Support  Summary of Progress/Problems:  Cheryl Cook 07/14/2022, 8:16 PM

## 2022-07-14 NOTE — ED Notes (Signed)
Pt IVC PER NP Cheryl Cook .
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
## 2022-07-15 ENCOUNTER — Other Ambulatory Visit: Payer: Self-pay

## 2022-07-15 DIAGNOSIS — F251 Schizoaffective disorder, depressive type: Secondary | ICD-10-CM | POA: Diagnosis not present

## 2022-07-15 LAB — LIPID PANEL
Cholesterol: 166 mg/dL (ref 0–200)
HDL: 37 mg/dL — ABNORMAL LOW (ref >40)
LDL Cholesterol: 109 mg/dL — ABNORMAL HIGH (ref 0–99)
Total CHOL/HDL Ratio: 4.5 RATIO
Triglycerides: 100 mg/dL (ref <150)
VLDL: 20 mg/dL (ref 0–40)

## 2022-07-15 LAB — HEMOGLOBIN A1C
Hgb A1c MFr Bld: 5.4 % (ref 4.8–5.6)
Mean Plasma Glucose: 108.28 mg/dL

## 2022-07-15 MED ORDER — HYDROXYZINE HCL 25 MG PO TABS
25.0000 mg | ORAL_TABLET | Freq: Three times a day (TID) | ORAL | 0 refills | Status: DC | PRN
  Filled 2022-07-15: qty 60, 20d supply, fill #0

## 2022-07-15 MED ORDER — MIRTAZAPINE 7.5 MG PO TABS: 7.5000 mg | ORAL_TABLET | Freq: Every day | ORAL | 1 refills | Status: DC

## 2022-07-15 MED ORDER — ARIPIPRAZOLE 5 MG PO TABS
5.0000 mg | ORAL_TABLET | Freq: Every day | ORAL | 0 refills | Status: DC
  Filled 2022-07-15: qty 30, 30d supply, fill #0

## 2022-07-15 MED ORDER — NICOTINE 21 MG/24HR TD PT24
21.0000 mg | MEDICATED_PATCH | Freq: Every day | TRANSDERMAL | 0 refills | Status: AC
  Filled 2022-07-15: qty 14, 14d supply, fill #0
  Filled 2022-07-25: qty 14, 14d supply, fill #1

## 2022-07-15 MED ORDER — HYDROXYZINE HCL 25 MG PO TABS: 25.0000 mg | ORAL_TABLET | Freq: Three times a day (TID) | ORAL | 1 refills | Status: DC | PRN

## 2022-07-15 MED ORDER — NICOTINE 21 MG/24HR TD PT24: 21.0000 mg | MEDICATED_PATCH | Freq: Every day | TRANSDERMAL | 1 refills | Status: DC

## 2022-07-15 MED ORDER — MIRTAZAPINE 7.5 MG PO TABS
7.5000 mg | ORAL_TABLET | Freq: Every day | ORAL | 0 refills | Status: DC
  Filled 2022-07-15: qty 30, 30d supply, fill #0

## 2022-07-15 MED ORDER — ARIPIPRAZOLE 5 MG PO TABS: 5.0000 mg | ORAL_TABLET | Freq: Every day | ORAL | 1 refills | Status: DC

## 2022-07-15 NOTE — BH IP Treatment Plan (Signed)
Interdisciplinary Treatment and Diagnostic Plan Update  07/15/2022 Time of Session: 9:30AM Cheryl Cook MRN: [id number]  Principal Diagnosis: Schizoaffective disorder, depressive type [facility])  Secondary Diagnoses: Principal Problem:   Schizoaffective disorder, depressive type ([name])   Current Medications:  Current Facility-Administered Medications  Medication Dose Route Frequency Provider Last Rate Last Admin   alum & mag hydroxide-simeth (MAALOX/MYLANTA) 200-200-20 MG/5ML suspension 30 mL  30 mL Oral Q4H PRN Vanetta Mulders, NP       ARIPiprazole (ABILIFY) tablet 5 mg  5 mg Oral Daily Clapacs, Jackquline Denmark, MD   5 mg at 07/15/22 0802   hydrOXYzine (ATARAX) tablet 25 mg  25 mg Oral TID PRN Vanetta Mulders, NP       ibuprofen (ADVIL) tablet 600 mg  600 mg Oral Q6H PRN Vanetta Mulders, NP       magnesium hydroxide (MILK OF MAGNESIA) suspension 30 mL  30 mL Oral Daily PRN Gabriel Cirri F, NP       mirtazapine (REMERON) tablet 7.5 mg  7.5 mg Oral QHS Barthold, Louise F, NP       nicotine (NICODERM CQ - dosed in mg/24 hours) patch 21 mg  21 mg Transdermal Daily Clapacs, Jackquline Denmark, MD   21 mg at 07/15/22 0803   PTA Medications: Medications Prior to Admission  Medication Sig Dispense Refill Last Dose   ABILIFY 2 MG tablet Take 2 mg by mouth daily. (Patient not taking: Reported on 07/13/2022)      citalopram (CELEXA) 40 MG tablet Take 1 tablet (40 mg total) by mouth daily after breakfast. (Patient not taking: Reported on 03/26/2021) 30 tablet 1    hydrOXYzine (ATARAX) 10 MG tablet Take 10 mg by mouth every 6 (six) hours as needed. (Patient not taking: Reported on 07/13/2022)      ibuprofen (MOTRIN IB) 200 MG tablet Take 3 tablets (600 mg total) by mouth every 6 (six) hours as needed. (Patient not taking: Reported on 07/13/2022) 60 tablet 0    melatonin 5 MG TABS Take 0.5 tablets (2.5 mg total) by mouth at bedtime. (Patient not taking: Reported on 03/26/2021) 30 tablet 1    OLANZapine (ZYPREXA) 10  MG tablet Take 1 tablet (10 mg total) by mouth at bedtime. (Patient not taking: Reported on 03/26/2021) 30 tablet 1    ondansetron (ZOFRAN-ODT) 4 MG disintegrating tablet Take 1 tablet (4 mg total) by mouth every 8 (eight) hours as needed for nausea or vomiting. (Patient not taking: Reported on 07/13/2022) 20 tablet 0    pantoprazole (PROTONIX) 40 MG tablet Take 1 tablet (40 mg total) by mouth daily. (Patient not taking: Reported on 03/26/2021) 30 tablet 1    zolpidem (AMBIEN) 5 MG tablet Take 1 tablet (5 mg total) by mouth at bedtime as needed for sleep. (Patient not taking: Reported on 03/26/2021) 30 tablet 0    [DISCONTINUED] mirtazapine (REMERON) 7.5 MG tablet Take 7.5 mg by mouth at bedtime. (Patient not taking: Reported on 07/13/2022)       Patient Stressors: Educational concerns   Occupational concerns    Patient Strengths: Motivation for treatment/growth  Supportive family/friends   Treatment Modalities: Medication Management, Group therapy, Case management,  1 to 1 session with clinician, Psychoeducation, Recreational therapy.   Physician Treatment Plan for Primary Diagnosis: Schizoaffective disorder, depressive type ([name]) Long Term Goal(s): Improvement in symptoms so as ready for discharge   Short Term Goals: Ability to maintain clinical measurements within normal limits will improve Compliance with prescribed medications will improve  Ability to verbalize feelings will improve Ability to demonstrate self-control will improve  Medication Management: Evaluate patient's response, side effects, and tolerance of medication regimen.  Therapeutic Interventions: 1 to 1 sessions, Unit Group sessions and Medication administration.  Evaluation of Outcomes: Adequate for Discharge  Physician Treatment Plan for Secondary Diagnosis: Principal Problem:   Schizoaffective disorder, depressive type ([name])  Long Term Goal(s): Improvement in symptoms so as ready for discharge   Short Term Goals:  Ability to maintain clinical measurements within normal limits will improve Compliance with prescribed medications will improve Ability to verbalize feelings will improve Ability to demonstrate self-control will improve     Medication Management: Evaluate patient's response, side effects, and tolerance of medication regimen.  Therapeutic Interventions: 1 to 1 sessions, Unit Group sessions and Medication administration.  Evaluation of Outcomes: Adequate for Discharge   RN Treatment Plan for Primary Diagnosis: Schizoaffective disorder, depressive type ([name]) Long Term Goal(s): Knowledge of disease and therapeutic regimen to maintain health will improve  Short Term Goals: Ability to demonstrate self-control, Ability to participate in decision making will improve, Ability to verbalize feelings will improve, Ability to disclose and discuss suicidal ideas, Ability to identify and develop effective coping behaviors will improve, and Compliance with prescribed medications will improve  Medication Management: RN will administer medications as ordered by provider, will assess and evaluate patient's response and provide education to patient for prescribed medication. RN will report any adverse and/or side effects to prescribing provider.  Therapeutic Interventions: 1 on 1 counseling sessions, Psychoeducation, Medication administration, Evaluate responses to treatment, Monitor vital signs and CBGs as ordered, Perform/monitor CIWA, COWS, AIMS and Fall Risk screenings as ordered, Perform wound care treatments as ordered.  Evaluation of Outcomes: Adequate for Discharge   LCSW Treatment Plan for Primary Diagnosis: Schizoaffective disorder, depressive type ([name]) Long Term Goal(s): Safe transition to appropriate next level of care at discharge, Engage patient in therapeutic group addressing interpersonal concerns.  Short Term Goals: Engage patient in aftercare planning with referrals and resources, Increase  social support, Increase ability to appropriately verbalize feelings, Increase emotional regulation, Facilitate acceptance of mental health diagnosis and concerns, and Increase skills for wellness and recovery  Therapeutic Interventions: Assess for all discharge needs, 1 to 1 time with Social worker, Explore available resources and support systems, Assess for adequacy in community support network, Educate family and significant other(s) on suicide prevention, Complete Psychosocial Assessment, Interpersonal group therapy.  Evaluation of Outcomes: Adequate for Discharge   Progress in Treatment: Attending groups: No. Participating in groups: No. Taking medication as prescribed: Yes. Toleration medication: Yes. Family/Significant other contact made: No, will contact:  once permission is given Patient understands diagnosis: Yes. Discussing patient identified problems/goals with staff: Yes. Medical problems stabilized or resolved: Yes. Denies suicidal/homicidal ideation: Yes. Issues/concerns per patient self-inventory: No. Other: none  New problem(s) identified: No, Describe:  none  New Short Term/Long Term Goal(s): medication management for mood stabilization; elimination of SI thoughts; development of comprehensive mental wellness/sobriety plan.   Patient Goals:  "just to get myself together even more so I can go home"  Discharge Plan or Barriers: CSW to assist in the development of appropriate discharge plans.  Patient reports that she would like to return home, unclear at this time if patient will see an outpatient provider.   Reason for Continuation of Hospitalization: Anxiety Depression Medication stabilization Suicidal ideation  Estimated Length of Stay:  1-7 days  Last 3 [place] Suicide Severity Risk Score: Flowsheet Row Admission (Current) from 07/14/2022 in [facility]  INPATIENT BEHAVIORAL MEDICINE ED from 07/13/2022 in [facility] EMERGENCY DEPARTMENT ED from  09/24/2021 in [facility] EMERGENCY DEPARTMENT  C-SSRS RISK CATEGORY No Risk No Risk No Risk       Last PHQ 2/9 Scores:     No data to display          Scribe for Treatment Team: Gaylan Gerold 07/15/2022 10:33 AM

## 2022-07-15 NOTE — BHH Counselor (Signed)
CSW met with pt briefly to discuss discharge plans. She endorsed plans to return home and that she has a ride. Pt stated that her and her mother will contact [name] to reschedule her appointment. CSW offered to call to reschedule appointment and pt stated that this would work. Consent was completed. Pt is a smoker who denied interest in cessation at this time. CSW offered education around [place]. She denied any use of substances or need for services to address these. No other concerns expressed. Contact ended without incident.   CSW contacted [facility]. He was informed that pt had been contacted prior to appointment as it had been cancelled due to lack of insurance. CSW confirmed that pt would not be able to have appointment scheduled there. This was confirmed. No other concerns expressed. Contact ended without incident.   CSW met with pt briefly to update regarding contact with [name]. She stated that she stands by her original plan as her and her mother are trying to work the insurance part out. CSW stated that this was fine. Pt was advised that the walk-in hours for [facility] would be added to her paperwork in case the need arises. She agreed and thanked CSW. No other concerns expressed. Contact ended without incident.   Chalmers Guest. Guerry Bruin, MSW, LCSW, [place] 07/15/2022 11:37 AM

## 2022-07-15 NOTE — Progress Notes (Signed)
Has been isolative to her room since shift change. Personable and med complaint.  Denies si hi avh. Support and encouragement offered. Q 15 minute safety checks in place.   C butler-Nicholson, LPN

## 2022-07-15 NOTE — BHH Suicide Risk Assessment (Signed)
[facility] Discharge Suicide Risk Assessment   Principal Problem: Schizoaffective disorder, depressive type ([name]) Discharge Diagnoses: Principal Problem:   Schizoaffective disorder, depressive type ([name])   Total Time spent with patient: 30 minutes  Musculoskeletal: Strength & Muscle Tone: within normal limits Gait & Station: normal Patient leans: N/A  Psychiatric Specialty Exam  Presentation  General Appearance:  Bizarre  Eye Contact: Fleeting  Speech: Clear and Coherent  Speech Volume: Increased  Handedness: Right   Mood and Affect  Mood: Anxious; Depressed; Irritable  Duration of Depression Symptoms: No data recorded Affect: Depressed; Blunt; Inappropriate   Thought Process  Thought Processes: Goal Directed  Descriptions of Associations:Loose  Orientation:Full (Time, Place and Person)  Thought Content:Rumination; Tangential  History of Schizophrenia/Schizoaffective disorder:Yes  Duration of Psychotic Symptoms:No data recorded Hallucinations:No data recorded Ideas of Reference:Paranoia  Suicidal Thoughts:No data recorded Homicidal Thoughts:No data recorded  Sensorium  Memory: Immediate Fair; Recent Fair; Remote Fair  Judgment: Poor  Insight: Poor   Executive Functions  Concentration: Poor  Attention Span: Poor  Recall: Poor  Fund of Knowledge: Poor  Language: Good   Psychomotor Activity  Psychomotor Activity:No data recorded  Assets  Assets: Desire for Improvement; Resilience; Financial Resources/Insurance; Social Support   Sleep  Sleep:No data recorded  Physical Exam: Physical Exam Vitals and nursing note reviewed.  Constitutional:      Appearance: Normal appearance.  HENT:     Head: Normocephalic and atraumatic.     Mouth/Throat:     Pharynx: Oropharynx is clear.  Eyes:     Pupils: Pupils are equal, round, and reactive to light.  Cardiovascular:     Rate and Rhythm: Normal rate and regular rhythm.   Pulmonary:     Effort: Pulmonary effort is normal.     Breath sounds: Normal breath sounds.  Abdominal:     General: Abdomen is flat.     Palpations: Abdomen is soft.  Musculoskeletal:        General: Normal range of motion.  Skin:    General: Skin is warm and dry.  Neurological:     General: No focal deficit present.     Mental Status: She is alert. Mental status is at baseline.  Psychiatric:        Attention and Perception: Attention normal.        Mood and Affect: Mood normal.        Speech: Speech normal.        Behavior: Behavior normal.        Thought Content: Thought content normal.        Cognition and Memory: Cognition normal.        Judgment: Judgment normal.    Review of Systems  Constitutional: Negative.   HENT: Negative.    Eyes: Negative.   Respiratory: Negative.    Cardiovascular: Negative.   Gastrointestinal: Negative.   Musculoskeletal: Negative.   Skin: Negative.   Neurological: Negative.   Psychiatric/Behavioral: Negative.     Blood pressure 129/89, pulse 99, temperature 98.4 F (36.9 C), temperature source Oral, resp. rate 18, height 5\' 3"  (1.6 m), weight 75.8 kg, last menstrual period 06/18/2022, SpO2 99 %. Body mass index is 29.58 kg/m.  Mental Status Per Nursing Assessment::   On Admission:  NA  Demographic Factors:  Caucasian  Loss Factors: Financial problems/change in socioeconomic status  Historical Factors: Impulsivity  Risk Reduction Factors:   Responsible for children under 47 years of age, Living with another person, especially a relative, Positive social support, Positive therapeutic relationship,  and Positive coping skills or problem solving skills  Continued Clinical Symptoms:  Bipolar Disorder:   Mixed State  Cognitive Features That Contribute To Risk:  None    Suicide Risk:  Minimal: No identifiable suicidal ideation.  Patients presenting with no risk factors but with morbid ruminations; may be classified as minimal  risk based on the severity of the depressive symptoms    Plan Of Care/Follow-up recommendations:  Other:  Patient will be discharged today.  No evidence of any suicidal ideation or suicidal behavior.  Denies suicidal or homicidal thoughts.  Behavior calm thoughts were lucid.  Agrees to outpatient follow-up.  Mordecai Rasmussen, MD 07/15/2022, 10:00 AM

## 2022-07-15 NOTE — Discharge Summary (Signed)
Physician Discharge Summary Note  Patient:  Cheryl Cook is an 41 y.o., female MRN:  [id number] DOB:  08-22-1981 Patient phone:  [phone] (home)  Patient address:   [address] [place] [address],  Total Time spent with patient: 30 minutes  Date of Admission:  07/14/2022 Date of Discharge: 07/15/2022  Reason for Admission: Patient was admitted after presenting to the emergency room with family reporting that she had been expressing greater anger and irritability at home.  History of schizoaffective or bipolar disorder  Principal Problem: Schizoaffective disorder, depressive type [facility]) Discharge Diagnoses: Principal Problem:   Schizoaffective disorder, depressive type ([name])   Past Psychiatric History: Patient has past history of bipolar or schizoaffective disorder with a past history of mood instability.  Has had previous hospitalizations.  Currently followed at [facility]  Past Medical History:  Past Medical History:  Diagnosis Date   Bipolar 1 disorder ([name])    Manic depression ([name])    Schizophrenia ([name])    History reviewed. No pertinent surgical history. Family History:  Family History  Problem Relation Age of Onset   Arthritis Mother    Asthma Daughter    Asthma Son    Diabetes Maternal Grandmother    Hypertension Maternal Grandmother    Glaucoma Maternal Grandmother    Diabetes Maternal Grandfather    Hypertension Maternal Grandfather    Glaucoma Maternal Grandfather    Aneurysm Maternal Grandfather    Congestive Heart Failure Maternal Grandfather    Asthma Half-Sister    Family Psychiatric  History: See previous. Social History:  Social History   Substance and Sexual Activity  Alcohol Use Not Currently     Social History   Substance and Sexual Activity  Drug Use Not Currently   Comment: last uded 09/2020    Social History   Socioeconomic History   Marital status: Single    Spouse name: Not on file   Number of children: Not on file    Years of education: Not on file   Highest education level: Not on file  Occupational History   Not on file  Tobacco Use   Smoking status: Every Day    Packs/day: 0.50    Years: 21.00    Total pack years: 10.50    Types: Cigarettes   Smokeless tobacco: Never  Vaping Use   Vaping Use: Never used  Substance and Sexual Activity   Alcohol use: Not Currently   Drug use: Not Currently    Comment: last uded 09/2020   Sexual activity: Not Currently    Partners: Male    Birth control/protection: None  Other Topics Concern   Not on file  Social History Narrative   Not on file   Social Determinants of Health   Financial Resource Strain: Not on file  Food Insecurity: No Food Insecurity (07/14/2022)   Hunger Vital Sign    Worried About Running Out of Food in the Last Year: Never true    Ran Out of Food in the Last Year: Never true  Transportation Needs: No Transportation Needs (07/14/2022)   PRAPARE - Administrator, Civil Service (Medical): No    Lack of Transportation (Non-Medical): No  Physical Activity: Not on file  Stress: Not on file  Social Connections: Not on file    Hospital Course: Patient admitted to psychiatric unit.  Patient had no dangerous aggressive violent or suicidal behaviors.  She was cooperative with treatment in the hospital.  I expressed to her that we had  good reason to believe that the Zyprexa had been helpful previously but she did not want to change back to that.  She did agree to increase her Abilify to 5 mg a day.  I suggested discontinuing the citalopram which is probably at least partially contributing to some mood instability.  Patient was cooperative with medication and has not shown any signs of acute dangerousness and completely denies any angry or homicidal thoughts.  She does not appear to meet commitment criteria and will be discharged with recommendations to follow up at [facility].  Physical Findings: AIMS: Facial and Oral  Movements Muscles of Facial Expression: None, normal Lips and Perioral Area: None, normal Jaw: None, normal Tongue: None, normal,Extremity Movements Upper (arms, wrists, hands, fingers): None, normal Lower (legs, knees, ankles, toes): None, normal, Trunk Movements Neck, shoulders, hips: None, normal, Overall Severity Severity of abnormal movements (highest score from questions above): None, normal Incapacitation due to abnormal movements: None, normal Patient's awareness of abnormal movements (rate only patient's report): No Awareness, Dental Status Current problems with teeth and/or dentures?: No Does patient usually wear dentures?: No  CIWA:    COWS:     Musculoskeletal: Strength & Muscle Tone: within normal limits Gait & Station: normal Patient leans: N/A   Psychiatric Specialty Exam:  Presentation  General Appearance:  Bizarre  Eye Contact: Fleeting  Speech: Clear and Coherent  Speech Volume: Increased  Handedness: Right   Mood and Affect  Mood: Anxious; Depressed; Irritable  Affect: Depressed; Blunt; Inappropriate   Thought Process  Thought Processes: Goal Directed  Descriptions of Associations:Loose  Orientation:Full (Time, Place and Person)  Thought Content:Rumination; Tangential  History of Schizophrenia/Schizoaffective disorder:Yes  Duration of Psychotic Symptoms:No data recorded Hallucinations:No data recorded Ideas of Reference:Paranoia  Suicidal Thoughts:No data recorded Homicidal Thoughts:No data recorded  Sensorium  Memory: Immediate Fair; Recent Fair; Remote Fair  Judgment: Poor  Insight: Poor   Executive Functions  Concentration: Poor  Attention Span: Poor  Recall: Poor  Fund of Knowledge: Poor  Language: Good   Psychomotor Activity  Psychomotor Activity:No data recorded  Assets  Assets: Desire for Improvement; Resilience; Financial Resources/Insurance; Social Support   Sleep  Sleep:No data  recorded   Physical Exam: Physical Exam Vitals and nursing note reviewed.  Constitutional:      Appearance: Normal appearance.  HENT:     Head: Normocephalic and atraumatic.     Mouth/Throat:     Pharynx: Oropharynx is clear.  Eyes:     Pupils: Pupils are equal, round, and reactive to light.  Cardiovascular:     Rate and Rhythm: Normal rate and regular rhythm.  Pulmonary:     Effort: Pulmonary effort is normal.     Breath sounds: Normal breath sounds.  Abdominal:     General: Abdomen is flat.     Palpations: Abdomen is soft.  Musculoskeletal:        General: Normal range of motion.  Skin:    General: Skin is warm and dry.  Neurological:     General: No focal deficit present.     Mental Status: She is alert. Mental status is at baseline.  Psychiatric:        Attention and Perception: Attention normal.        Mood and Affect: Mood normal.        Speech: Speech normal.        Behavior: Behavior is cooperative.        Thought Content: Thought content normal.  Cognition and Memory: Cognition normal.    Review of Systems  Constitutional: Negative.   HENT: Negative.    Eyes: Negative.   Respiratory: Negative.    Cardiovascular: Negative.   Gastrointestinal: Negative.   Musculoskeletal: Negative.   Skin: Negative.   Neurological: Negative.   Psychiatric/Behavioral: Negative.     Blood pressure 129/89, pulse 99, temperature 98.4 F (36.9 C), temperature source Oral, resp. rate 18, height 5\' 3"  (1.6 m), weight 75.8 kg, last menstrual period 06/18/2022, SpO2 99 %. Body mass index is 29.58 kg/m.   Social History   Tobacco Use  Smoking Status Every Day   Packs/day: 0.50   Years: 21.00   Total pack years: 10.50   Types: Cigarettes  Smokeless Tobacco Never   Tobacco Cessation:  A prescription for an FDA-approved tobacco cessation medication provided at discharge   Blood Alcohol level:  Lab Results  Component Value Date   [facility] <10 07/13/2022   ETH <10  07/26/2020    Metabolic Disorder Labs:  Lab Results  Component Value Date   HGBA1C 5.6 07/26/2020   MPG 114.02 07/26/2020   MPG 125.5 02/03/2020   No results found for: "PROLACTIN" Lab Results  Component Value Date   CHOL 166 07/15/2022   TRIG 100 07/15/2022   HDL 37 (L) 07/15/2022   CHOLHDL 4.5 07/15/2022   VLDL 20 07/15/2022   LDLCALC 109 (H) 07/15/2022   LDLCALC 93 07/26/2020    See Psychiatric Specialty Exam and Suicide Risk Assessment completed by Attending Physician prior to discharge.  Discharge destination:  Home  Is patient on multiple antipsychotic therapies at discharge:  No   Has Patient had three or more failed trials of antipsychotic monotherapy by history:  No  Recommended Plan for Multiple Antipsychotic Therapies: NA  Discharge Instructions     Diet - low sodium heart healthy   Complete by: As directed    Increase activity slowly   Complete by: As directed       Allergies as of 07/15/2022   No Known Allergies      Medication List     STOP taking these medications    citalopram 40 MG tablet Commonly known as: CELEXA   ibuprofen 200 MG tablet Commonly known as: Motrin IB   melatonin 5 MG Tabs   OLANZapine 10 MG tablet Commonly known as: ZYPREXA   ondansetron 4 MG disintegrating tablet Commonly known as: ZOFRAN-ODT   pantoprazole 40 MG tablet Commonly known as: PROTONIX   zolpidem 5 MG tablet Commonly known as: AMBIEN       TAKE these medications      Indication  ARIPiprazole 5 MG tablet Commonly known as: ABILIFY Take 1 tablet (5 mg total) by mouth daily. Start taking on: July 16, 2022 What changed:  medication strength how much to take  Indication: MIXED BIPOLAR AFFECTIVE DISORDER   hydrOXYzine 25 MG tablet Commonly known as: ATARAX Take 1 tablet (25 mg total) by mouth 3 (three) times daily as needed for anxiety. What changed:  medication strength how much to take when to take this reasons to take this   Indication: Feeling Anxious   mirtazapine 7.5 MG tablet Commonly known as: REMERON Take 1 tablet (7.5 mg total) by mouth at bedtime.  Indication: Major Depressive Disorder   nicotine 21 mg/24hr patch Commonly known as: NICODERM CQ - dosed in mg/24 hours Place 1 patch (21 mg total) onto the skin daily. Start taking on: July 16, 2022  Indication: Nicotine Addiction  Follow-up recommendations:  Other:  Follow-up with outpatient mental health provider discontinue substance abuse continue current medicine.  Prescriptions and supply of medicine to be provided.  Comments: See above  Signed: Mordecai Rasmussen, MD 07/15/2022, 10:03 AM

## 2022-07-15 NOTE — BHH Counselor (Signed)
Adult Comprehensive Assessment  Patient ID: Cheryl Cook, female   DOB: [birth year], 41 y.o.   MRN: [id number]  Information Source: Information source: Patient  Current Stressors:  Patient states their primary concerns and needs for treatment are:: "We just needed to see if one of my medications needed to be upped." Patient states their goals for this hospitilization and ongoing recovery are:: "Just to get myself together even more so I can go home. Just to get better."  Living/Environment/Situation:  Living Arrangements: Children  Family History:     Childhood History:     Education:     Employment/Work Situation:      Architect:      Alcohol/Substance Abuse:      Social Support System:      Leisure/Recreation:      Strengths/Needs:      Discharge Plan:      Summary/Recommendations:   Emergency planning/management officer and Recommendations (to be completed by the evaluator): Patient is a 41 year old female who resides in [place], [place] [facility] Pt presents to [facility] ED with family due to greater anger and irritability at home. She expressed a desire to get herself together so she can go home. Pt has a history of schizophrenia. She denies any SI/AH/HI. Pt stated that she would like to continue with [facility] when she is discharged from the hospital. Patient will benefit from crisis stabilization, medication evaluation, group therapy and psychoeducation, in addition to case management for discharge planning. At discharge it is recommended that Patient adhere to the established discharge plan and continue in treatment.  [name]. 07/15/2022

## 2022-07-15 NOTE — Progress Notes (Signed)
Recreation Therapy Notes  Date: 07/15/2022  Time: 10:45 am    Location: Craft room   Behavioral response: Appropriate   Intervention Topic:  Self-care    Discussion/Intervention:  Group content today was focused on Self-Care. The group defined self-care and some positive ways they care for themselves. Individuals expressed ways and reasons why they neglected any self-care in the past. Patients described ways to improve self-care in the future. The group explained what could happen if they did not do any self-care activities at all. The group participated in the intervention "self-care assessment" where they had a chance to discover some of their weaknesses and strengths in self- care. Patient came up with a self-care plan to improve themselves in the future.  Clinical Observations/Feedback: Patient came to group and was focused on what peers and staff had to say about self care. She defined self-care as making sure you can do what do you need to. Participant explained that she meditates for self-care. Individual was social with peers and staff while participating in the intervention.        Neria Procter LRT/CTRS         Idaliz Tinkle 07/15/2022 1:40 PM

## 2022-07-15 NOTE — Progress Notes (Signed)
Discharge note: RN met with pt and reviewed pt's discharge instructions. Pt verbalized understanding of discharge instructions and pt did not have any questions. RN reviewed and provided pt with a copy of SRA, AVS and Transition Record. RN returned pt's belongings to pt. Prescriptions and samples were given to pt. Pt denied SI/HI/AVH and voiced no concerns. Pt was appreciative of the care pt received at [facility]. Patient discharged to their ride without incident.  Problem: Education: Goal: Knowledge of General Education information will improve Description: Including pain rating scale, medication(s)/side effects and non-pharmacologic comfort measures Outcome: Adequate for Discharge   Problem: Coping: Goal: Level of anxiety will decrease Outcome: Adequate for Discharge    07/15/22 0802  Psych Admission Type (Psych Patients Only)  Admission Status Involuntary  Psychosocial Assessment  Patient Complaints Anxiety;Depression  Eye Contact Fair  Facial Expression Other (Comment) (WDL)  Affect Appropriate to circumstance  Speech Logical/coherent  Interaction Assertive  Motor Activity Other (Comment) (WDL)  Appearance/Hygiene Unremarkable  Behavior Characteristics Cooperative;Appropriate to situation;Anxious  Mood Depressed;Anxious;Pleasant  Thought Process  Coherency WDL  Content WDL  Delusions None reported or observed  Perception WDL  Hallucination None reported or observed  Judgment WDL  Confusion None  Danger to Self  Current suicidal ideation? Denies  Danger to Others  Danger to Others None reported or observed

## 2022-07-15 NOTE — BHH Suicide Risk Assessment (Signed)
[facility] INPATIENT:  Family/Significant Other Suicide Prevention Education  Suicide Prevention Education:  Patient Refusal for Family/Significant Other Suicide Prevention Education: The patient Cheryl Cook has refused to provide written consent for family/significant other to be provided Family/Significant Other Suicide Prevention Education during admission and/or prior to discharge.  Physician notified.  SPE completed with pt, as pt refused to consent to family contact. SPI pamphlet provided to pt and pt was encouraged to share information with support network, ask questions, and talk about any concerns relating to SPE. Pt denies access to guns/firearms and verbalized understanding of information provided. Mobile Crisis information also provided to pt.  [name] 07/15/2022, 1:17 PM

## 2022-07-15 NOTE — Plan of Care (Signed)
  Problem: Education: Goal: Knowledge of General Education information will improve Description: Including pain rating scale, medication(s)/side effects and non-pharmacologic comfort measures Outcome: Progressing   Problem: Pain Managment: Goal: General experience of comfort will improve Outcome: Progressing   

## 2022-07-15 NOTE — Progress Notes (Signed)
  [facility] Adult Case Management Discharge Plan :  Will you be returning to the same living situation after discharge:  Yes,  pt plans to return home upon discharge. At discharge, do you have transportation home?: Yes,  pt family to provide transportation. Do you have the ability to pay for your medications: No.  Release of information consent forms completed and in the chart;  Patient's signature needed at discharge.  Patient to Follow up at:  Follow-up Information     [facility] Follow up.   Why: A resource in case it is needed. They have walk-in hours on Mondays, Wednesdays, and Fridays from 8am-4pm. As clients are seen on a first come, first served basis, it is recommended that you arrive before 7:30am in order to be seen the same day. Thanks! Contact information: [address] [place] [address] [phone]                 Next level of care provider has access to [facility] Link:no  Safety Planning and Suicide Prevention discussed: Yes,  SPE completed with pt.     Has patient been referred to the Quitline?: Patient refused referral  Patient has been referred for addiction treatment: Pt. refused referral  [name], LCSW 07/15/2022, 1:18 PM

## 2022-07-25 ENCOUNTER — Other Ambulatory Visit: Payer: Self-pay

## 2022-07-25 ENCOUNTER — Encounter (HOSPITAL_COMMUNITY): Payer: Self-pay

## 2022-07-25 ENCOUNTER — Other Ambulatory Visit (HOSPITAL_COMMUNITY): Payer: Self-pay

## 2022-07-28 ENCOUNTER — Other Ambulatory Visit (HOSPITAL_COMMUNITY): Payer: Self-pay

## 2022-07-30 ENCOUNTER — Other Ambulatory Visit: Payer: Self-pay

## 2022-08-07 ENCOUNTER — Other Ambulatory Visit: Payer: Self-pay

## 2022-08-11 ENCOUNTER — Other Ambulatory Visit: Payer: Self-pay

## 2022-08-11 MED ORDER — HYDROXYZINE HCL 25 MG PO TABS
25.0000 mg | ORAL_TABLET | Freq: Three times a day (TID) | ORAL | 1 refills | Status: AC | PRN
  Filled 2022-08-11: qty 60, 20d supply, fill #0

## 2022-08-11 MED ORDER — MIRTAZAPINE 7.5 MG PO TABS
7.5000 mg | ORAL_TABLET | Freq: Every day | ORAL | 1 refills | Status: AC
  Filled 2022-08-11: qty 30, 30d supply, fill #0

## 2022-08-11 MED ORDER — ARIPIPRAZOLE 5 MG PO TABS
5.0000 mg | ORAL_TABLET | Freq: Every day | ORAL | 1 refills | Status: AC
  Filled 2022-08-11: qty 30, 30d supply, fill #0

## 2022-08-28 ENCOUNTER — Other Ambulatory Visit: Payer: Self-pay

## 2022-09-01 ENCOUNTER — Emergency Department
Admission: EM | Admit: 2022-09-01 | Discharge: 2022-09-01 | Disposition: A | Discharge status: Home / Self Care | Payer: Medicaid Other | Attending: Emergency Medicine | Admitting: Emergency Medicine

## 2022-09-01 ENCOUNTER — Other Ambulatory Visit: Payer: Self-pay

## 2022-09-01 DIAGNOSIS — H9202 Otalgia, left ear: Secondary | ICD-10-CM | POA: Diagnosis present

## 2022-09-01 DIAGNOSIS — H60312 Diffuse otitis externa, left ear: Secondary | ICD-10-CM | POA: Insufficient documentation

## 2022-09-01 MED ORDER — NEOMYCIN-POLYMYXIN-HC 3.5-10000-1 OT SOLN
3.0000 [drp] | Freq: Four times a day (QID) | OTIC | 0 refills | Status: AC
  Filled 2022-09-01: qty 10, 17d supply, fill #0

## 2022-09-01 NOTE — ED Provider Notes (Signed)
   [facility] Provider Note    None    (approximate)   History   Otalgia   HPI  Cheryl Cook is a 42 y.o. female   presents to the ED with complaint of left ear pain started 1 week ago.  Patient has history of "swimmer's ear".  She denies any fever, chills, nausea or vomiting.  Patient has a history of schizoaffective disorder, bipolar, drug abuse/dependence.    Physical Exam   Triage Vital Signs: ED Triage Vitals  Enc Vitals Group     BP 09/01/22 1039 106/79     Pulse Rate 09/01/22 1039 92     Resp 09/01/22 1039 16     Temp 09/01/22 1039 99.3 F (37.4 C)     Temp Source 09/01/22 1039 Oral     SpO2 09/01/22 1039 99 %     Weight 09/01/22 1040 190 lb (86.2 kg)     Height 09/01/22 1040 5\' 3"  (1.6 m)     Head Circumference --      Peak Flow --      Pain Score 09/01/22 1057 8     Pain Loc --      Pain Edu? --      Excl. in [place]? --     Most recent vital signs: Vitals:   09/01/22 1039  BP: 106/79  Pulse: 92  Resp: 16  Temp: 99.3 F (37.4 C)  SpO2: 99%     General: Awake, no distress.  CV:  Good peripheral perfusion.  Resp:  Normal effort.  Abd:  No distention.  Other:  Examination of the left EAC there is mild edema and exudate present.  TM is visible and no erythema noted.  Right EAC and TM were clear.   ED Results / Procedures / Treatments   Labs (all labs ordered are listed, but only abnormal results are displayed) Labs Reviewed - No data to display    PROCEDURES:  Critical Care performed:   Procedures   MEDICATIONS ORDERED IN ED: Medications - No data to display   IMPRESSION / MDM / [place] / ED COURSE  I reviewed the triage vital signs and the nursing notes.   Differential diagnosis includes, but is not limited to, otitis media, otitis externa, upper respiratory infection.  42 year old female presents to the ED with complaint of left ear pain and drainage.  On exam there is tenderness, swelling and  exudate consistent with an otitis externa.  A prescription for eardrops was sent to the pharmacy for patient to begin taking and she is to follow-up with [facility] if any continued problems or not improving.      Patient's presentation is most consistent with Patient's presentation is most consistent with acute, uncomplicated illness.  FINAL CLINICAL IMPRESSION(S) / ED DIAGNOSES   Final diagnoses:  Acute diffuse otitis externa of left ear     Rx / DC Orders   ED Discharge Orders          Ordered    neomycin-polymyxin-hydrocortisone (CORTISPORIN) OTIC solution  4 times daily        09/01/22 1106             Note:  This document was prepared using Dragon voice recognition software and may include unintentional dictation errors.   Johnn Hai, PA-C 09/01/22 1458    Duffy Bruce, MD 09/01/22 1550

## 2022-09-01 NOTE — Discharge Instructions (Addendum)
Begin using eardrops as directed.  You may use Tylenol or ibuprofen as needed for ear pain.  Follow-up with [facility] if any continued problems or not improving.  The phone number and address are listed on your discharge papers for you to call and make an appointment if needed.

## 2022-09-01 NOTE — ED Triage Notes (Signed)
Pt presents to ED with c/o of L ear pain that started 1 week ago. Pt states just started to get a low grade fever. NAD noted. Pt states HX of ear infections.

## 2022-12-28 ENCOUNTER — Encounter (HOSPITAL_COMMUNITY): Payer: Self-pay

## 2023-01-15 ENCOUNTER — Other Ambulatory Visit: Payer: Self-pay

## 2023-01-15 ENCOUNTER — Emergency Department
Admission: EM | Admit: 2023-01-15 | Discharge: 2023-01-15 | Disposition: A | Discharge status: Home / Self Care | Payer: Medicaid Other | Attending: Student in an Organized Health Care Education/Training Program | Admitting: Student in an Organized Health Care Education/Training Program

## 2023-01-15 DIAGNOSIS — S46811A Strain of other muscles, fascia and tendons at shoulder and upper arm level, right arm, initial encounter: Secondary | ICD-10-CM | POA: Diagnosis not present

## 2023-01-15 DIAGNOSIS — X501XXA Overexertion from prolonged static or awkward postures, initial encounter: Secondary | ICD-10-CM | POA: Diagnosis not present

## 2023-01-15 DIAGNOSIS — M546 Pain in thoracic spine: Secondary | ICD-10-CM | POA: Diagnosis present

## 2023-01-15 DIAGNOSIS — Y990 Civilian activity done for income or pay: Secondary | ICD-10-CM | POA: Diagnosis not present

## 2023-01-15 NOTE — ED Triage Notes (Signed)
Pt to ED for pain to back, right shoulder and neck for past couple days. 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

## 2023-01-15 NOTE — Discharge Instructions (Signed)
Use ice, rest, and take ibuprofen 800mg  every 6-8 hours.  Avoid overhead lifting until pain resolves.

## 2023-01-15 NOTE — ED Provider Notes (Signed)
[facility] Provider Note    Event Date/Time   First MD Initiated Contact with Patient 01/15/23 1216     (approximate)   History   Back Pain   HPI  Cheryl Cook is a 42 y.o. female with past medical history of substance abuse, schizoaffective disorder, bipolar 1, and as listed in EMR presents to the emergency department for treatment and evaluation of pain in the right upper back, right shoulder, and right lateral neck. 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 She tried to go back to work today, but pain is worsening.     Physical Exam   Triage Vital Signs: ED Triage Vitals [01/15/23 1213]  Enc Vitals Group     BP 109/60     Pulse Rate 96     Resp 16     Temp 97.7 F (36.5 C)     Temp src      SpO2 97 %     Weight 184 lb (83.5 kg)     Height 5' 3.5" (1.613 m)     Head Circumference      Peak Flow      Pain Score 10     Pain Loc      Pain Edu?      Excl. in [place]?     Most recent vital signs: Vitals:   01/15/23 1213  BP: 109/60  Pulse: 96  Resp: 16  Temp: 97.7 F (36.5 C)  SpO2: 97%    General: Awake, no distress.  CV:  Good peripheral perfusion.  Resp:  Normal effort.  Abd:  No distention.  Other:  Tenderness over the area of the trapezius muscle on the right side. No focal midline cervical tenderness. Pain increases with turning head to right and raising shoulder.   ED Results / Procedures / Treatments   Labs (all labs ordered are listed, but only abnormal results are displayed) Labs Reviewed - No data to display   EKG  Not indicated.   RADIOLOGY  Image and radiology report reviewed and interpreted by me. Radiology report consistent with the same.  Not indicated.  PROCEDURES:  Critical Care performed: No  Procedures   MEDICATIONS ORDERED IN ED:  Medications - No data to display   IMPRESSION / MDM / ASSESSMENT AND PLAN / ED COURSE   I have reviewed the triage  note.  Differential diagnosis includes, but is not limited to, musculoskeletal strain  Patient's presentation is most consistent with acute, uncomplicated illness.  42 year old female presenting to the emergency department for treatment and evaluation of right side upper back, right shoulder, and right side neck pain.  See HPI for further details.  Exam is consistent with muscle strain.  She prefers conservative management and will rest, ice, and take ibuprofen.  Work excuse for the next 2 days provided as well.  She was encouraged to see primary care if symptoms or not improving.  She is to return to the emergency department for symptoms of change or worsen if she is unable to schedule appointment.      FINAL CLINICAL IMPRESSION(S) / ED DIAGNOSES   Final diagnoses:  Trapezius muscle strain, right, initial encounter     Rx / DC Orders   ED Discharge Orders     None        Note:  This document was prepared using Dragon voice recognition software and may include unintentional dictation errors.   Kasim Mccorkle,  Kasandra Knudsen, FNP 01/15/23 1324    Willy Eddy, MD 01/15/23 1332

## 2023-03-17 ENCOUNTER — Ambulatory Visit (LOCAL_COMMUNITY_HEALTH_CENTER): Payer: 59

## 2023-03-17 VITALS — BP 109/77 | Ht 63.5 in | Wt 180.0 lb

## 2023-03-17 DIAGNOSIS — Z3009 Encounter for other general counseling and advice on contraception: Secondary | ICD-10-CM | POA: Diagnosis not present

## 2023-03-17 DIAGNOSIS — Z3201 Encounter for pregnancy test, result positive: Secondary | ICD-10-CM

## 2023-03-17 LAB — PREGNANCY, URINE: Preg Test, Ur: POSITIVE — AB

## 2023-03-17 MED ORDER — PRENATAL 27-0.8 MG PO TABS: 1.0000 | ORAL_TABLET | Freq: Every day | ORAL | Status: AC

## 2023-03-17 NOTE — Progress Notes (Signed)
UPT positive. Plans prenatal care at [facility]. Positive preg packet given and reviewed.   Pt reports taking Abilify and mirtazapine as prescribed by provider at [facility]. Patient concerned if these meds are safe in preg.   Consult E Ivett Luebbe, CNM who advises patient to contact prescribing provider for medication guidance while pregnant.  RN discussed provider recommendation with patient and she plans to contact provider. Questions answered and reports understanding.   The patient was dispensed prenatal vitamins #100 today per [facility] Dr Ralene Bathe. I provided counseling today regarding the medication. We discussed the medication, the side effects and when to call clinic. Patient given the opportunity to ask questions. Questions answered.    Sent to clerk for preadmit. Jerel Shepherd, RN Consulted on the plan of care for this client.  I agree with the documented note and actions taken to provide care for this client.  Hazle Coca, CNM

## 2023-03-17 NOTE — Progress Notes (Signed)
UPT positive. Plans prenatal care at [facility]. Positive preg packet given and reviewed.   Pt reports taking Abilify and mirtazapine as prescribed by provider at [facility]. Patient concerned if these meds are safe in preg.   Consult E Sciora, CNM who advises patient to contact prescribing provider for medication guidance while pregnant.  RN discussed provider recommendation with patient and she plans to contact provider. Questions answered and reports understanding.   The patient was dispensed prenatal vitamins #100 today per [facility] Dr Ralene Bathe. I provided counseling today regarding the medication. We discussed the medication, the side effects and when to call clinic. Patient given the opportunity to ask questions. Questions answered.    Sent to clerk for preadmit. Jerel Shepherd, RN

## 2023-03-18 DIAGNOSIS — F3181 Bipolar II disorder: Secondary | ICD-10-CM | POA: Diagnosis not present

## 2023-03-18 DIAGNOSIS — F4312 Post-traumatic stress disorder, chronic: Secondary | ICD-10-CM | POA: Diagnosis not present

## 2023-03-18 DIAGNOSIS — F411 Generalized anxiety disorder: Secondary | ICD-10-CM | POA: Diagnosis not present

## 2023-03-29 ENCOUNTER — Other Ambulatory Visit: Payer: Self-pay | Admitting: Certified Nurse Midwife

## 2023-03-29 ENCOUNTER — Telehealth: Payer: Self-pay | Admitting: Family Medicine

## 2023-03-29 ENCOUNTER — Encounter: Payer: Self-pay | Admitting: Advanced Practice Midwife

## 2023-03-29 ENCOUNTER — Emergency Department: Payer: MEDICAID

## 2023-03-29 ENCOUNTER — Other Ambulatory Visit: Payer: Self-pay

## 2023-03-29 ENCOUNTER — Emergency Department
Admission: EM | Admit: 2023-03-29 | Discharge: 2023-03-29 | Disposition: A | Discharge status: Home / Self Care | Payer: MEDICAID | Attending: Student in an Organized Health Care Education/Training Program | Admitting: Student in an Organized Health Care Education/Training Program

## 2023-03-29 DIAGNOSIS — Z3A01 Less than 8 weeks gestation of pregnancy: Secondary | ICD-10-CM | POA: Diagnosis not present

## 2023-03-29 DIAGNOSIS — O3680X0 Pregnancy with inconclusive fetal viability, not applicable or unspecified: Secondary | ICD-10-CM

## 2023-03-29 DIAGNOSIS — O4691 Antepartum hemorrhage, unspecified, first trimester: Secondary | ICD-10-CM | POA: Diagnosis present

## 2023-03-29 DIAGNOSIS — O4690 Antepartum hemorrhage, unspecified, unspecified trimester: Secondary | ICD-10-CM

## 2023-03-29 DIAGNOSIS — O034 Incomplete spontaneous abortion without complication: Secondary | ICD-10-CM | POA: Insufficient documentation

## 2023-03-29 LAB — BASIC METABOLIC PANEL
Anion gap: 8 (ref 5–15)
BUN: 6 mg/dL (ref 6–20)
CO2: 21 mmol/L — ABNORMAL LOW (ref 22–32)
Calcium: 8.8 mg/dL — ABNORMAL LOW (ref 8.9–10.3)
Chloride: 107 mmol/L (ref 98–111)
Creatinine, Ser: 0.75 mg/dL (ref 0.44–1.00)
GFR, Estimated: >60 mL/min (ref >60)
Glucose, Bld: 97 mg/dL (ref 70–99)
Potassium: 3.8 mmol/L (ref 3.5–5.1)
Sodium: 136 mmol/L (ref 135–145)

## 2023-03-29 LAB — URINALYSIS, ROUTINE W REFLEX MICROSCOPIC
Bilirubin Urine: NEGATIVE
Glucose, UA: NEGATIVE mg/dL
Ketones, ur: NEGATIVE mg/dL
Nitrite: NEGATIVE
Protein, ur: NEGATIVE mg/dL
Specific Gravity, Urine: 1.005 (ref 1.005–1.030)
pH: 7 (ref 5.0–8.0)

## 2023-03-29 LAB — CBC
HCT: 42.2 % (ref 36.0–46.0)
Hemoglobin: 14.1 g/dL (ref 12.0–15.0)
MCH: 31.5 pg (ref 26.0–34.0)
MCHC: 33.4 g/dL (ref 30.0–36.0)
MCV: 94.4 fL (ref 80.0–100.0)
Platelets: 312 10*3/uL (ref 150–400)
RBC: 4.47 MIL/uL (ref 3.87–5.11)
RDW: 12.8 % (ref 11.5–15.5)
WBC: 9 10*3/uL (ref 4.0–10.5)
nRBC: 0 % (ref 0.0–0.2)

## 2023-03-29 LAB — HCG, QUANTITATIVE, PREGNANCY: hCG, Beta Chain, Quant, S: 8856 m[IU]/mL — ABNORMAL HIGH (ref <5)

## 2023-03-29 LAB — POC URINE PREG, ED: Preg Test, Ur: POSITIVE — AB

## 2023-03-29 LAB — ABO/RH: ABO/RH(D): O POS

## 2023-03-29 NOTE — Telephone Encounter (Signed)
Patient has New OB Apt  on 04/05/2023. She has called this morning to report bleeding and passing a clot. She would like an apt today or someone to call and talk to her. Thank you!

## 2023-03-29 NOTE — Consult Note (Signed)
Consult History and Physical   SERVICE: Gynecology   Patient Name: Cheryl Cook Patient MRN:   [id number]  CC: vaginal bleeding  HPI: Cheryl Cook is a 42 y.o. [id number] with vaginal bleeding & abdominal pain that began on Saturday. Saturday she passed something that looked different than a usual blood clot, it was dime sized and spongy. She has had cramping & lower abdominal pain, across the whole pelvis but worse to right side & in the middle.   Review of Systems: positives in bold GEN:   fevers, chills, weight changes, appetite changes, fatigue, night sweats CV:   CP, palpitations PULM:  SOB, cough GI:  abd pain, N/V/D/C GU:  dysuria, urgency, frequency HEME/LYMPH:  easy bruising or bleeding GU: vaginal bleeding   Past Obstetrical History: OB History     Gravida  7   Para  5   Term  5   Preterm      AB  1   Living  5      SAB  0   IAB  1   Ectopic  0   Multiple  0   Live Births  5           Past Gynecologic History: Patient's last menstrual period was 01/11/2023 (exact date).   Past Medical History: Past Medical History:  Diagnosis Date   Bipolar 1 disorder ([name])    Manic depression ([name])    Schizophrenia ([name])     Past Surgical History:   Past Surgical History:  Procedure Laterality Date   NO PAST SURGERIES      Family History:  family history includes Aneurysm in her maternal grandfather; Arthritis in her mother; Asthma in her daughter, half-sister, and son; Congestive Heart Failure in her maternal grandfather; Diabetes in her maternal grandfather and maternal grandmother; Glaucoma in her maternal grandfather and maternal grandmother; Hypertension in her maternal grandfather and maternal grandmother.  Social History:  Social History   Socioeconomic History   Marital status: Single    Spouse name: Not on file   Number of children: Not on file   Years of education: Not on file   Highest education level: Not on file  Occupational  History   Not on file  Tobacco Use   Smoking status: Every Day    Current packs/day: 0.50    Average packs/day: 0.5 packs/day for 21.0 years (10.5 ttl pk-yrs)    Types: Cigarettes   Smokeless tobacco: Never  Vaping Use   Vaping status: Never Used  Substance and Sexual Activity   Alcohol use: Not Currently    Comment: last use 3 yrs ago   Drug use: Not Currently    Types: Marijuana    Comment: last use 3 yrs ago   Sexual activity: Not Currently    Partners: Male    Birth control/protection: None, Implant, Injection    Comment: last sex 3 mo ago / hx implant/depo  Other Topics Concern   Not on file  Social History Narrative   Not on file   Social Determinants of Health   Financial Resource Strain: Not on file  Food Insecurity: No Food Insecurity (07/14/2022)   Hunger Vital Sign    Worried About Running Out of Food in the Last Year: Never true    Ran Out of Food in the Last Year: Never true  Transportation Needs: No Transportation Needs (07/14/2022)   PRAPARE - Administrator, Civil Service (Medical): No    Lack of Transportation (  Non-Medical): No  Physical Activity: Not on file  Stress: Not on file  Social Connections: Not on file  Intimate Partner Violence: Not At Risk (03/17/2023)   Humiliation, Afraid, Rape, and Kick questionnaire    Fear of Current or Ex-Partner: No    Emotionally Abused: No    Physically Abused: No    Sexually Abused: No    Home Medications:  Medications reconciled in EPIC  No current facility-administered medications on file prior to encounter.   Current Outpatient Medications on File Prior to Encounter  Medication Sig Dispense Refill   ARIPiprazole (ABILIFY) 5 MG tablet Take 1 tablet (5 mg total) by mouth daily. 30 tablet 1   hydrOXYzine (ATARAX) 25 MG tablet Take 1 tablet (25 mg total) by mouth 3 (three) times daily as needed for anxiety (Patient not taking: Reported on 03/17/2023) 60 tablet 1   mirtazapine (REMERON) 7.5 MG tablet  Take 1 tablet (7.5 mg total) by mouth at bedtime. 30 tablet 1   nicotine (NICODERM CQ - DOSED IN MG/24 HOURS) 21 mg/24hr patch Place 1 patch (21 mg total) onto the skin daily. (Patient not taking: Reported on 03/17/2023) 28 patch 0   Prenatal Vit-Fe Fumarate-FA (MULTIVITAMIN-PRENATAL) 27-0.8 MG TABS tablet Take 1 tablet by mouth daily at 12 noon.      Allergies:  No Known Allergies  Physical Exam:  Temp:  [98.7 F (37.1 C)] 98.7 F (37.1 C) (08/05 1140) Pulse Rate:  [91] 91 (08/05 1140) Resp:  [16] 16 (08/05 1140) BP: (110)/(57) 110/57 (08/05 1140) SpO2:  [97 %] 97 % (08/05 1140) Weight:  [81.6 kg] 81.6 kg (08/05 1138)   General Appearance:  Well developed, well nourished, no acute distress, alert and oriented, cooperative and appears stated age Abdomen:  Soft, tender to suprapubic area & right lower quadrant. Neurologic:  Cranial nerves 2-12 grossly intact, normal speech, no focal findings or movement disorder noted. Psychiatric:  Normal mood and affect, appropriate, no AH/VH Pelvic:  deferred   Labs/Studies:   CBC and Coags:  Lab Results  Component Value Date   WBC 9.0 03/29/2023   NEUTOPHILPCT 52 10/26/2020   EOSPCT 4 10/26/2020   BASOPCT 1 10/26/2020   LYMPHOPCT 34 10/26/2020   HGB 14.1 03/29/2023   HCT 42.2 03/29/2023   MCV 94.4 03/29/2023   PLT 312 03/29/2023   CMP:  Lab Results  Component Value Date   NA 136 03/29/2023   K 3.8 03/29/2023   CL 107 03/29/2023   CO2 21 (L) 03/29/2023   BUN 6 03/29/2023   CREATININE 0.75 03/29/2023   CREATININE 0.80 07/13/2022   CREATININE 0.79 10/26/2020   PROT 7.8 07/13/2022   BILITOT 0.6 07/13/2022   ALT 14 07/13/2022   AST 16 07/13/2022   ALKPHOS 64 07/13/2022   Other Labs:   Other Imaging: US OB LESS THAN 14 WEEKS WITH OB TRANSVAGINAL  Result Date: 03/29/2023 CLINICAL DATA:  Vaginal bleeding EXAM: OBSTETRIC <14 WK [place] AND TRANSVAGINAL OB US TECHNIQUE: Both transabdominal and transvaginal ultrasound examinations  were performed for complete evaluation of the gestation as well as the maternal uterus, adnexal regions, and pelvic cul-de-sac. Transvaginal technique was performed to assess early pregnancy. COMPARISON:  None Available. FINDINGS: Intrauterine gestational sac: Possible single Yolk sac:  Not Visualized. Embryo:  Not Visualized. Cardiac Activity: Not Visualized. Heart Rate: NA MSD: 26.8 mm   7 w   5 d CRL: N/A Subchorionic hemorrhage:  None visualized. Maternal uterus/adnexae: Normal ovaries. IMPRESSION: Pregnancy of unknown location with indeterminate  intrauterine fluid collection. Recommend follow up beta-HCG levels and endovaginal ultrasound examination in 7-10 days. Electronically Signed   By: Agustin Cree M.D.   On: 03/29/2023 13:33     Assessment / Plan:   SHWANDA BENYO is a 42 y.o. [id number] who presents with vaginal bleeding and concern for miscarriage vs ectopic pregnancy. Labs & ultrasound reviewed, findings concerning for miscarriage vs ectopic. Discussed with Dr. Valentino Saxon & ultrasound reviewed by MD. Recommend close outpatient follow up with Hcg level in 2 days, office visit later this week & repeat ultrasound if Hcg not falling. Ectopic precautions and when to return to seek urgent/emergent care reviewed with patient.  Thank you for the opportunity to be involved with this patient's care.  ----- Dominica Severin, CNM [facility]  OB/Gyn [facility]

## 2023-03-29 NOTE — Telephone Encounter (Signed)
Returned call to patient who states she has been bleeding since 04/13/23 and has passed a small clot. Patient counseled to go to the ED to be evaluated. Patient states understanding and states she will go. Patient has a new OB appointment at [facility] next week. Provider aware of situation.Burt Knack, RN

## 2023-03-29 NOTE — ED Provider Notes (Signed)
-----------------------------------------   4:25 PM on 03/29/2023 -----------------------------------------  Blood pressure (!) 110/57, pulse 91, temperature 98.7 F (37.1 C), temperature source Oral, resp. rate 16, height 5' 3.5" (1.613 m), weight 81.6 kg, last menstrual period 01/11/2023, SpO2 97%.  Assuming care from [facility], [place].  In short, Cheryl Cook is a 42 y.o. female with a chief complaint of Vaginal Bleeding .  Refer to the original H&P for additional details.  The current plan of care is to await consultation with OB/GYN.  Patient had presented to the emergency department with positive pregnancy with vaginal bleeding.  Patient's labs are reassuring, she is Rh+ and no indication for RhoGAM.  Patient had an ultrasound which reveals a pregnancy of unknown location with possible intrauterine fluid/collapse of the gestational sac.  At this time OB/GYN had been consulted.  The nurse midwife was waiting to discuss the case with attending OB, Dr. Valentino Saxon.  Attending OB was in surgery and ultimately was able to provide instructions.  Nurse midwife presented and evaluated the patient.  Currently pregnancy of unknown anatomic location.  If scheduled outpatient labs check serial hCG.  Patient already has an appointment with the health department but if she cannot get into see them in a reasonable amount of time, follow-up directly with OB/GYN.  I have given strict return precautions for increasing bleeding, increasing pain or other concerning signs and symptoms.  Patient verbalizes understanding of same.  Patient will follow-up for outpatient labs, follow-up with OB.   ED diagnosis:   Pregnancy of unknown anatomic location Vaginal bleeding in pregnancy.    Racheal Patches, PA-C 03/29/23 1629    Sharyn Creamer, MD 03/30/23 [phone]

## 2023-03-29 NOTE — ED Triage Notes (Signed)
Pt here with vaginal bleeding since Sat. Pt states she saw a large clot. Pt is currently [redacted] weeks pregnant. Pt also having back pain as well. Pt takes Lurasidone and prenatal vitamins.
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
## 2023-03-31 ENCOUNTER — Ambulatory Visit: Payer: MEDICAID | Admitting: Family Medicine

## 2023-03-31 DIAGNOSIS — O034 Incomplete spontaneous abortion without complication: Secondary | ICD-10-CM | POA: Diagnosis not present

## 2023-03-31 DIAGNOSIS — O3680X0 Pregnancy with inconclusive fetal viability, not applicable or unspecified: Secondary | ICD-10-CM

## 2023-03-31 NOTE — Progress Notes (Signed)
Patient aware of return visit Monday at 0950. BTHIELE RN

## 2023-04-01 NOTE — Progress Notes (Signed)
Reviewed, level decreased from 03/29/23. Pt already has f/u appt for repeat level 04/05/23.

## 2023-04-05 ENCOUNTER — Other Ambulatory Visit: Payer: MEDICAID

## 2023-04-05 ENCOUNTER — Telehealth: Payer: Self-pay | Admitting: Family Medicine

## 2023-04-05 DIAGNOSIS — O034 Incomplete spontaneous abortion without complication: Secondary | ICD-10-CM | POA: Diagnosis not present

## 2023-04-05 DIAGNOSIS — O3680X0 Pregnancy with inconclusive fetal viability, not applicable or unspecified: Secondary | ICD-10-CM

## 2023-04-05 NOTE — Telephone Encounter (Signed)
Call to client who is requesting results of beta hcg drawn this am. Counseled results could take 24 - 72 hours to be resulted and reviewed by a provider. Per client, is having vagnal bleeding but is like a normal period. Aware of when to go to hospital if bleeding increases. Jossie Ng, RN

## 2023-04-05 NOTE — Telephone Encounter (Signed)
Patient was here earlier for lab testing and would like to talk to someone. She has questions and said she hasn't been able to talk to anyone.

## 2023-04-05 NOTE — Progress Notes (Signed)
Here for Beta HCG. RN counseled patient that she will be contacted with results and next steps. Jerel Shepherd, RN

## 2023-04-06 ENCOUNTER — Emergency Department
Admission: EM | Admit: 2023-04-06 | Discharge: 2023-04-06 | Disposition: A | Discharge status: Home / Self Care | Payer: MEDICAID | Attending: Emergency Medicine | Admitting: Emergency Medicine

## 2023-04-06 ENCOUNTER — Encounter: Payer: Self-pay | Admitting: Emergency Medicine

## 2023-04-06 ENCOUNTER — Other Ambulatory Visit: Payer: Self-pay

## 2023-04-06 ENCOUNTER — Telehealth: Payer: Self-pay

## 2023-04-06 ENCOUNTER — Emergency Department: Payer: MEDICAID

## 2023-04-06 DIAGNOSIS — O034 Incomplete spontaneous abortion without complication: Secondary | ICD-10-CM | POA: Diagnosis not present

## 2023-04-06 DIAGNOSIS — N939 Abnormal uterine and vaginal bleeding, unspecified: Secondary | ICD-10-CM | POA: Diagnosis present

## 2023-04-06 LAB — CBC
HCT: 42.7 % (ref 36.0–46.0)
Hemoglobin: 14.2 g/dL (ref 12.0–15.0)
MCH: 32 pg (ref 26.0–34.0)
MCHC: 33.3 g/dL (ref 30.0–36.0)
MCV: 96.2 fL (ref 80.0–100.0)
Platelets: 292 10*3/uL (ref 150–400)
RBC: 4.44 MIL/uL (ref 3.87–5.11)
RDW: 12.9 % (ref 11.5–15.5)
WBC: 9.6 10*3/uL (ref 4.0–10.5)
nRBC: 0 % (ref 0.0–0.2)

## 2023-04-06 LAB — HCG, QUANTITATIVE, PREGNANCY: hCG, Beta Chain, Quant, S: 2860 m[IU]/mL — ABNORMAL HIGH (ref <5)

## 2023-04-06 LAB — TYPE AND SCREEN
ABO/RH(D): O POS
Antibody Screen: NEGATIVE

## 2023-04-06 MED ORDER — MISOPROSTOL 200 MCG PO TABS
ORAL_TABLET | ORAL | 1 refills | Status: AC
  Filled 2023-04-06: qty 4, 1d supply, fill #0

## 2023-04-06 MED ORDER — OXYCODONE-ACETAMINOPHEN 5-325 MG PO TABS
1.0000 | ORAL_TABLET | ORAL | 0 refills | Status: AC | PRN
  Filled 2023-04-06: qty 5, 1d supply, fill #0

## 2023-04-06 MED ORDER — IBUPROFEN 600 MG PO TABS
600.0000 mg | ORAL_TABLET | Freq: Once | ORAL | Status: AC
  Administered 2023-04-06: 600 mg via ORAL
  Filled 2023-04-06: qty 1

## 2023-04-06 NOTE — ED Notes (Signed)
Pt requesting ibuprofen for cramping. MD made aware via secure chat

## 2023-04-06 NOTE — ED Notes (Addendum)
Pt given warm blankets. Pt denies any further needs at this time.

## 2023-04-06 NOTE — Discharge Instructions (Signed)
As discussed, you may take the Cytotec that was prescribed by the OB/GYN.  This may help the miscarriage proceed more quickly although can sometimes increase cramping and pain.  Follow-up at the OB/GYN clinic on Thursday or Friday.  You should call tomorrow to arrange the follow-up appointment.  Let them know you were seen in the ER.  Return immediately for new, worsening, or persistent severe bleeding especially more than 1 pad per hour, severe pain, weakness or lightheadedness, or any other new or worsening symptoms that concern you.

## 2023-04-06 NOTE — Telephone Encounter (Signed)
bleeding a lot and wants to know if she should go to the hosiptal

## 2023-04-06 NOTE — ED Notes (Signed)
Pt ambulatory at discharge. Discharge instructions reviewed. Pt verbalized understanding. RN informed educated pt to come back to ED if new or worsening symptoms presented.

## 2023-04-06 NOTE — ED Provider Notes (Signed)
[facility] Provider Note    Event Date/Time   First MD Initiated Contact with Patient 04/06/23 1212     (approximate)   History   Vaginal Bleeding   HPI  Cheryl Cook is a 42 y.o. female G6P5 with LMP of 5/20 who presents with vaginal bleeding, onset around 11 AM, a moderate amount with her having to change her pad 3 times over the last 2 hours.  She reports crampy abdominal pain that is similar to a period cramp.  She denies any lightheadedness.  She has no vomiting or diarrhea.  The patient states she had an ultrasound last week which did not show a pregnancy in the uterus.  She has been back to the health department several times to check her hCG which has been trending down so she was told she likely had a miscarriage.  She passed some tissue 3 days ago but has not been having any bleeding before today.  Reviewed the past medical records.  The patient was seen in the ED on 8/5 and had an ultrasound showing no IUP.  hCG at that time was 8856.  Repeat hCG at the health department on 8/7 was 4173, and yesterday it was 2426.   Physical Exam   Triage Vital Signs: ED Triage Vitals  Encounter Vitals Group     BP 04/06/23 1150 105/87     Systolic BP Percentile --      Diastolic BP Percentile --      Pulse Rate 04/06/23 1150 (!) 108     Resp 04/06/23 1150 17     Temp 04/06/23 1150 98.3 F (36.8 C)     Temp Source 04/06/23 1150 Oral     SpO2 04/06/23 1150 98 %     Weight 04/06/23 1152 179 lb (81.2 kg)     Height 04/06/23 1152 5' 3.5" (1.613 m)     Head Circumference --      Peak Flow --      Pain Score 04/06/23 1152 3     Pain Loc --      Pain Education --      Exclude from Growth Chart --     Most recent vital signs: Vitals:   04/06/23 1150 04/06/23 1230  BP: 105/87   Pulse: (!) 108 93  Resp: 17   Temp: 98.3 F (36.8 C)   SpO2: 98% 99%     General: Awake, no distress.  CV:  Good peripheral perfusion.  Resp:  Normal effort.  Abd:  Soft  and nontender.  No distention.  Other:  Normal external genitalia.  Vaginal vault with mild pooling of blood but no active hemorrhage.  Cervical os fingertip.   ED Results / Procedures / Treatments   Labs (all labs ordered are listed, but only abnormal results are displayed) Labs Reviewed  HCG, QUANTITATIVE, PREGNANCY - Abnormal; Notable for the following components:      Result Value   hCG, Beta Chain, Quant, S 2,860 (*)    All other components within normal limits  CBC  TYPE AND SCREEN     EKG     RADIOLOGY  US OB: I independently viewed and interpreted the images; there is no IUP or FHR.  Radiology impression is as follows:  IMPRESSION:  Complex fluid now seen along the lower uterine segment and  endocervical canal as opposed to the endometrium more superiorly on  the prior examination with some heterogeneous material. With the  patient's history this  could be a spontaneous ongoing incomplete  abortion but there is a differential and in principle ectopic is not  excluded. No free fluid. Recommend close follow-up with serial beta  HCG and ultrasound.     PROCEDURES:  Critical Care performed: No  Procedures   MEDICATIONS ORDERED IN ED: Medications  ibuprofen (ADVIL) tablet 600 mg (600 mg Oral Given 04/06/23 1430)     IMPRESSION / MDM / ASSESSMENT AND PLAN / ED COURSE  I reviewed the triage vital signs and the nursing notes.  42 year old female G6P5 with LMP of 5/20 and ultrasound a week ago showing pregnancy of unknown location with subsequent downtrending hCG presents with acute onset of vaginal bleeding and some crampy pain.  Differential diagnosis includes, but is not limited to, incomplete versus complete miscarriage.  The patient has no abdominal tenderness or peritoneal signs and there is no clinical evidence for ruptured ectopic pregnancy.  We will obtain lab workup, ultrasound, and reassess.  Patient's presentation is most consistent with acute  complicated illness / injury requiring diagnostic workup.  ----------------------------------------- 4:33 PM on 04/06/2023 -----------------------------------------  hCG is essentially unchanged from yesterday.  Hemoglobin is normal.  The ultrasound shows complex heterogeneous fluid in the lower uterine segment consistent with incomplete miscarriage although ectopic cannot be completely ruled out.  The patient reports significant improvement in her bleeding.  Her vital signs are stable.  There is no clinical evidence for ectopic pregnancy.  I consulted and discussed the case with Dr. Logan Bores and Haynes Kerns from OB/GYN.  They reviewed the case and advised that the patient is appropriate for discharge with close outpatient follow-up in 2 or 3 days.  They have prescribed her Cytotec although advised that it is optional.  I counseled the patient on the results of the workup.  She appears comfortable and states she feels ready to go home.  I counseled her on use of the Cytotec and on the follow-up plan.  I gave strict return precautions and she expressed understanding.  FINAL CLINICAL IMPRESSION(S) / ED DIAGNOSES   Final diagnoses:  Incomplete miscarriage     Rx / DC Orders   ED Discharge Orders          Ordered    misoprostol (CYTOTEC) 200 MCG tablet        04/06/23 1625    oxyCODONE-acetaminophen (PERCOCET/ROXICET) 5-325 MG tablet  Every 4 hours PRN        04/06/23 1625             Note:  This document was prepared using Dragon voice recognition software and may include unintentional dictation errors.    Dionne Bucy, MD 04/06/23 [phone]

## 2023-04-06 NOTE — ED Triage Notes (Signed)
Patient arrives ambulatory by POV c/o vaginal bleeding onset of about 20 minutes ago. States she had a miscarriage a week ago. C/o lower abdominal pain and cramping.

## 2023-04-06 NOTE — Telephone Encounter (Signed)
Call to client to refer her to [facility] ED for vaginal bleeding evaluation. No answer and per recorded message, number is not available at this time. Call to mother (emergency contact) who states client has gone to the hospital because of heavy bleeding. Per [facility], client is at [facility] ED currently. Jossie Ng, RN

## 2023-04-06 NOTE — ED Notes (Signed)
Pt given soda and graham crackers per request

## 2023-04-07 ENCOUNTER — Telehealth: Payer: Self-pay

## 2023-04-07 ENCOUNTER — Other Ambulatory Visit: Payer: Self-pay

## 2023-04-07 NOTE — Telephone Encounter (Signed)
Phone call to patient to schedule next Bhcg lab. Per patient she went to [facility] ED 04/06/23 for SAB follow up and was given "four pills to insert vaginally" and keep 04/13/23 scheduled AOB follow up appt. Consulted with Hazle Coca, CNM regarding above. Informed patient she does not need to come here for next Bhcg lab and stressed importance of keeping above scheduled appt for serial Bhcg labs until level is at 0. Patient verbalized understanding and stated she will keep AOB scheduled appt.  Tawny Hopping, RN Consulted on the plan of care for this client.  I agree with the documented note and actions taken to provide care for this client.  Hazle Coca, CNM

## 2023-04-07 NOTE — Telephone Encounter (Signed)
Phone call to patient to schedule next Bhcg lab. Per patient she went to [facility] ED 04/06/23 for SAB follow up and was given "four pills to insert vaginally" and keep 04/13/23 scheduled AOB follow up appt. Consulted with Hazle Coca, CNM regarding above. Informed patient she does not need to come here for next Bhcg lab and stressed importance of keeping above scheduled appt for serial Bhcg labs until level is at 0. Patient verbalized understanding and stated she will keep AOB scheduled appt. Tawny Hopping, RN

## 2023-04-13 ENCOUNTER — Ambulatory Visit: Payer: MEDICAID | Admitting: Obstetrics & Gynecology

## 2023-04-13 ENCOUNTER — Other Ambulatory Visit: Payer: Self-pay

## 2023-04-13 VITALS — BP 101/70 | HR 84 | Wt 182.8 lb

## 2023-04-13 DIAGNOSIS — O039 Complete or unspecified spontaneous abortion without complication: Secondary | ICD-10-CM

## 2023-04-13 DIAGNOSIS — O3680X0 Pregnancy with inconclusive fetal viability, not applicable or unspecified: Secondary | ICD-10-CM

## 2023-04-13 DIAGNOSIS — Z30013 Encounter for initial prescription of injectable contraceptive: Secondary | ICD-10-CM | POA: Diagnosis not present

## 2023-04-13 MED ORDER — MEDROXYPROGESTERONE ACETATE 150 MG/ML IM SUSY
150.0000 mg | PREFILLED_SYRINGE | Freq: Once | INTRAMUSCULAR | Status: AC
Start: 2023-04-13 — End: 2023-04-13
  Administered 2023-04-13: 150 mg via INTRAMUSCULAR

## 2023-04-13 NOTE — Addendum Note (Signed)
Addended by: Cornelius Moras D on: 04/13/2023 08:35 AM   Modules accepted: Orders

## 2023-04-13 NOTE — Progress Notes (Signed)
    GYNECOLOGY PROGRESS NOTE  Subjective:    Patient ID: Cheryl Cook, female    DOB: [birth year], 42 y.o.   MRN: [id number]  HPI  Patient is a 42 y.o. [id number] who presents for follow up of miscarriage. She was first seen at the ED 03/29/2023 with bleeding in early pregnancy. Her QBHCG has fallen from greater than 8000 to around 2000. She will get another lab draw today.  She placed 4 cytotec in her vagina last night, has had only a little spotting and no cramping.  She would like to restart depo provera for contraception (has used this off and on for about 18 years).  The following portions of the patient's history were reviewed and updated as appropriate: allergies, current medications, past family history, past medical history, past social history, past surgical history, and problem list.  Review of Systems Pertinent items are noted in HPI.  Her last pap was in 2021 and was ASCUS, negative HR HPV.  Objective:   Blood pressure 101/70, pulse 84, weight 182 lb 12.8 oz (82.9 kg), last menstrual period 01/11/2023. Body mass index is 31.87 kg/m. Well nourished, well hydrated White female, no apparent distress She is ambulating and conversing normally.  General appearance: alert Abdomen: soft, non-tender; bowel sounds normal; no masses,  no organomegaly EG- normal Spec exam reveals a patulous normal-appearing cervix with some bloody mucous, no CMT, no evidence of infection   Assessment:   1. Miscarriage   2. Encounter for initial prescription of injectable contraceptive      Plan:   1. Miscarriage  - B-HCG Quant weekly until negative  2. Encounter for initial prescription of injectable contraceptive - restart depo provera today  3. She will come back for next depo in 3 months and will get annual exam at that time.

## 2023-04-14 LAB — HUMAN CHORIONIC GONADOTROPIN(HCG),B-SUBUNIT,QUANTITATIVE): HCG, Beta Chain, Quant, S: 80 m[IU]/mL

## 2023-04-20 ENCOUNTER — Other Ambulatory Visit: Payer: Self-pay

## 2023-04-20 ENCOUNTER — Other Ambulatory Visit: Payer: MEDICAID

## 2023-04-20 DIAGNOSIS — O039 Complete or unspecified spontaneous abortion without complication: Secondary | ICD-10-CM

## 2023-04-21 LAB — HUMAN CHORIONIC GONADOTROPIN(HCG),B-SUBUNIT,QUANTITATIVE): HCG, Beta Chain, Quant, S: 35 m[IU]/mL

## 2023-04-27 ENCOUNTER — Other Ambulatory Visit: Payer: MEDICAID

## 2023-04-27 ENCOUNTER — Other Ambulatory Visit: Payer: Self-pay

## 2023-04-27 DIAGNOSIS — O039 Complete or unspecified spontaneous abortion without complication: Secondary | ICD-10-CM

## 2023-04-28 LAB — BETA HCG QUANT (REF LAB): hCG Quant: 14 m[IU]/mL

## 2023-05-04 ENCOUNTER — Other Ambulatory Visit: Payer: MEDICAID

## 2023-05-04 DIAGNOSIS — O3680X0 Pregnancy with inconclusive fetal viability, not applicable or unspecified: Secondary | ICD-10-CM

## 2023-05-05 LAB — BETA HCG QUANT (REF LAB): hCG Quant: 8 m[IU]/mL

## 2023-05-10 ENCOUNTER — Encounter: Payer: Self-pay | Admitting: Obstetrics & Gynecology

## 2023-05-10 ENCOUNTER — Other Ambulatory Visit: Payer: Self-pay | Admitting: Obstetrics & Gynecology

## 2023-05-10 DIAGNOSIS — O039 Complete or unspecified spontaneous abortion without complication: Secondary | ICD-10-CM

## 2023-07-13 ENCOUNTER — Ambulatory Visit: Payer: MEDICAID | Admitting: Obstetrics & Gynecology

## 2023-07-19 ENCOUNTER — Ambulatory Visit: Payer: MEDICAID | Admitting: Obstetrics & Gynecology

## 2023-07-21 ENCOUNTER — Encounter: Payer: Self-pay | Admitting: Obstetrics

## 2023-07-21 ENCOUNTER — Ambulatory Visit (INDEPENDENT_AMBULATORY_CARE_PROVIDER_SITE_OTHER): Payer: MEDICAID | Admitting: Obstetrics

## 2023-07-21 ENCOUNTER — Other Ambulatory Visit (HOSPITAL_COMMUNITY)
Admission: RE | Admit: 2023-07-21 | Discharge: 2023-07-21 | Disposition: A | Discharge status: Home / Self Care | Payer: MEDICAID | Source: Ambulatory Visit | Attending: Obstetrics | Admitting: Obstetrics

## 2023-07-21 VITALS — BP 110/70 | HR 98 | Ht 60.35 in | Wt 189.0 lb

## 2023-07-21 DIAGNOSIS — Z3202 Encounter for pregnancy test, result negative: Secondary | ICD-10-CM | POA: Diagnosis not present

## 2023-07-21 DIAGNOSIS — Z01419 Encounter for gynecological examination (general) (routine) without abnormal findings: Secondary | ICD-10-CM | POA: Diagnosis present

## 2023-07-21 DIAGNOSIS — Z13220 Encounter for screening for lipoid disorders: Secondary | ICD-10-CM

## 2023-07-21 DIAGNOSIS — Z124 Encounter for screening for malignant neoplasm of cervix: Secondary | ICD-10-CM

## 2023-07-21 DIAGNOSIS — F17210 Nicotine dependence, cigarettes, uncomplicated: Secondary | ICD-10-CM

## 2023-07-21 DIAGNOSIS — Z131 Encounter for screening for diabetes mellitus: Secondary | ICD-10-CM

## 2023-07-21 DIAGNOSIS — Z3042 Encounter for surveillance of injectable contraceptive: Secondary | ICD-10-CM | POA: Diagnosis not present

## 2023-07-21 DIAGNOSIS — A6000 Herpesviral infection of urogenital system, unspecified: Secondary | ICD-10-CM | POA: Insufficient documentation

## 2023-07-21 DIAGNOSIS — Z1231 Encounter for screening mammogram for malignant neoplasm of breast: Secondary | ICD-10-CM

## 2023-07-21 DIAGNOSIS — R87610 Atypical squamous cells of undetermined significance on cytologic smear of cervix (ASC-US): Secondary | ICD-10-CM | POA: Insufficient documentation

## 2023-07-21 LAB — POCT URINE PREGNANCY: Preg Test, Ur: NEGATIVE

## 2023-07-21 MED ORDER — MEDROXYPROGESTERONE ACETATE 150 MG/ML IM SUSP
150.0000 mg | Freq: Once | INTRAMUSCULAR | Status: AC
Start: 2023-07-21 — End: 2023-07-21
  Administered 2023-07-21: 150 mg via INTRAMUSCULAR

## 2023-07-21 NOTE — Patient Instructions (Signed)
Contraceptive Injection  A contraceptive injection is a shot that prevents pregnancy. It is also called a birth control shot. The shot contains the hormone progestin, which prevents pregnancy by:  Stopping the ovaries from releasing eggs.  Thickening cervical mucus to prevent sperm from entering the cervix.  Thinning the lining of the uterus to prevent a fertilized egg from attaching to the uterus.  Contraceptive injections are given under the skin (subcutaneous) or into a muscle (intramuscular). For these shots to work, you must get one of them every 3 months (12-13 weeks) from a health care provider.  Tell a health care provider about:  Any allergies you have.  All medicines you are taking, including vitamins, herbs, eye drops, creams, and over-the-counter medicines.  Any blood disorders you have.  Any medical conditions you have.  Whether you are pregnant or may be pregnant.  What are the risks?  Generally, this is a safe procedure. However, problems may occur, including:  Mood changes or depression.  Loss of bone density (osteoporosis) after long-term use.  Blood clots. This is rare.  Higher risk of an egg being fertilized outside your uterus (ectopic pregnancy).This is rare.  What happens before the procedure?  Your health care provider may do a routine physical exam.  You may have a test to make sure you are not pregnant.  What happens during the procedure?    The area where the shot will be given will be cleaned and sanitized with alcohol.  A needle will be inserted into a muscle in your upper arm or buttock, or into the skin of your thigh or abdomen. The needle will be attached to a syringe with the medicine inside of it.  The medicine will be pushed through the syringe and injected into your body.  A small bandage (dressing) may be placed over the injection site.  What can I expect after the procedure?  After the procedure, it is common to have:  Soreness around the injection site for a couple of  days.  Irregular menstrual bleeding.  Weight gain.  Breast tenderness.  Headaches.  Discomfort in your abdomen.  Ask your health care provider whether you need to use an added method of birth control (backup contraception), such as a condom, sponge, or spermicide.  If the first shot is given 1-7 days after the start of your last menstrual period, you will not need backup contraception.  If the first shot is given at any other time during your menstrual cycle, you should avoid having sex. If you do have sex, you will need to use backup contraception for 7 days after you receive the shot.  Follow these instructions at home:  General instructions  Take over-the-counter and prescription medicines only as told by your health care provider.  Do not rub or massage the injection site.  Track your menstrual periods so you will know if they become irregular.  Always use a condom to protect against sexually transmitted infections (STIs).  Make sure you schedule an appointment in time for your next shot and mark it on your calendar. You must get an injection every 3 months (12-13 weeks) to prevent pregnancy.  Lifestyle  Do not use any products that contain nicotine or tobacco. These products include cigarettes, chewing tobacco, and vaping devices, such as e-cigarettes. If you need help quitting, ask your health care provider.  Eat foods that are high in calcium and vitamin D, such as milk, cheese, and salmon. Doing this may help with any  loss in bone density caused by the contraceptive injection. Ask your health care provider for dietary recommendations.  Contact a health care provider if you:  Have nausea or vomiting.  Have abnormal vaginal discharge or bleeding.  Miss a menstrual period or think you might be pregnant.  Experience mood changes or depression.  Feel dizzy or light-headed.  Have leg pain.  Get help right away if you:  Have chest pain or cough up blood.  Have shortness of breath.  Have a severe headache that does  not go away.  Have numbness in any part of your body.  Have slurred speech or vision problems.  Have vaginal bleeding that is abnormally heavy or does not stop, or you have severe pain in your abdomen.  Have depression that does not get better with treatment.  If you ever feel like you may hurt yourself or others, or have thoughts about taking your own life, get help right away. Go to your nearest emergency department or:  Call your local emergency services (911 in the [place]  Call a suicide crisis helpline, such as the National Suicide Prevention Lifeline at 639-302-2684 or 988 in the [place] This is open 24 hours a day in the [place]  Text the Crisis Text Line at 6613050312 (in the [place]  Summary  A contraceptive injection is a shot that prevents pregnancy. It is also called the birth control shot.  The shot is given under the skin (subcutaneous) or into a muscle (intramuscular).  After this procedure, it is common to have soreness around the injection site for a couple of days.  To prevent pregnancy, the shot must be given by a health care provider every 3 months (12-13 weeks).  After you have the shot, ask your health care provider whether you need to use an added method of birth control (backup contraception), such as a condom, sponge, or spermicide.  This information is not intended to replace advice given to you by your health care provider. Make sure you discuss any questions you have with your health care provider.  Document Revised: 03/05/2021 Document Reviewed: 02/19/2020  Elsevier Patient Education  2024 [employer].

## 2023-07-21 NOTE — Progress Notes (Signed)
GYNECOLOGY: ANNUAL EXAM   Subjective:    PCP: Department, [facility] Cheryl Cook is a 42 y.o. female [phone] who presents for annual wellness visit.   Well Woman Visit:  GYN HISTORY:  Patient's last menstrual period was 06/06/2023 (exact date).     Menstrual History: OB History     Gravida  7   Para  5   Term  5   Preterm      AB  2   Living  5      SAB  0   IAB  1   Ectopic  0   Multiple  0   Live Births  36           Menarche age: 62 Patient's last menstrual period was 06/06/2023 (exact date). Period Cycle (Days): 30 Period Duration (Days): 6-7 Period Pattern: Regular Menstrual Flow: Heavy, Moderate, Light Menstrual Control: Maxi pad, Thin pad Menstrual Control Change Freq (Hours): 2 Dysmenorrhea: (!) Mild Dysmenorrhea Symptoms: CrampingWhat    Periods are every 30 days, and last 6-7 days, flow is light / moderate / heavy.  Uses pads / tampons / menstrual cup and changes it every 2 hours.  Cramping is mild / moderate / severe.  Cyclic symptoms include: pelvic pain.  Intermenstrual bleeding, spotting, or discharge? Yes since her 5th kid  Urinary incontinence? Yes   Sexually active: Yes  Number of sexual partners: 1  Gender of sexual Partners: male  Social History   Substance and Sexual Activity  Sexual Activity Yes   Partners: Male   Birth control/protection: Injection   Comment: last sex 3 mo ago / hx implant/depo   Contraceptive methods: Depo Dyspareunia? Yes  STI history: Yes STI/HIV testing or immunizations needed? No.   Health Maintenance: -Last pap: was abnormal: 3/23/21ASCUS &HPV NEGATIVE   --> Any abnormals: Yes   -FMH of Breast / Colon / Cervical cancer: uterine cancer mom at 63 -Vaccines:  Immunization History  Administered Date(s) Administered   Tdap 09/07/2012   Last Tdap: utd / Flu: declined / COVID: utd / Gardasil: utd / Shingles (50+): n/a / PCV20:  -Hep C screen: utd -Last lipid / glucose  screening: today  > Exercise: none, not active > Dietary Supplements: Folate: No;  Calcium: No}; Vitamin D: No > Body mass index is 36.48 kg/m.  > Recent dental visit No. > Seat Belt Use: Yes.   > Texting and driving? No. > Guns in the house No. > Recreational or other drug use: denied.   Social History   Tobacco Use   Smoking status: Every Day    Current packs/day: 0.50    Average packs/day: 0.5 packs/day for 21.0 years (10.5 ttl pk-yrs)    Types: Cigarettes   Smokeless tobacco: Never  Substance Use Topics   Alcohol use: Not Currently    Comment: Last use 2021   Lives with: kids   _________________________________________________________  Current Outpatient Medications  Medication Sig Dispense Refill   ARIPiprazole (ABILIFY) 5 MG tablet Take 1 tablet (5 mg total) by mouth daily. 30 tablet 1   medroxyPROGESTERone (DEPO-PROVERA) 150 MG/ML injection Inject 150 mg into the muscle every 3 (three) months.     mirtazapine (REMERON) 7.5 MG tablet Take 1 tablet (7.5 mg total) by mouth at bedtime. 30 tablet 1   hydrOXYzine (ATARAX) 25 MG tablet Take 1 tablet (25 mg total) by mouth 3 (three) times daily as needed for anxiety (Patient not taking: Reported on 03/17/2023) 60 tablet 1  misoprostol (CYTOTEC) 200 MCG tablet Insert four tablets vaginally the night prior to your appointment (Patient not taking: Reported on 07/21/2023) 4 tablet 1   nicotine (NICODERM CQ - DOSED IN MG/24 HOURS) 21 mg/24hr patch Place 1 patch (21 mg total) onto the skin daily. (Patient not taking: Reported on 03/17/2023) 28 patch 0   oxyCODONE-acetaminophen (PERCOCET/ROXICET) 5-325 MG tablet Take 1 tablet by mouth every 4 (four) hours as needed for severe pain. (Patient not taking: Reported on 07/21/2023) 5 tablet 0   Current Facility-Administered Medications  Medication Dose Route Frequency Provider Last Rate Last Admin   medroxyPROGESTERone (DEPO-PROVERA) injection 150 mg  150 mg Intramuscular Once        No  Known Allergies  Past Medical History:  Diagnosis Date   Bipolar 1 disorder ([name])    Manic depression ([name])    Schizophrenia ([name])    Past Surgical History:  Procedure Laterality Date   NO PAST SURGERIES      Review Of Systems  Constitutional: Denied constitutional symptoms, night sweats, recent illness, fatigue, fever, insomnia and weight loss.  Eyes: Denied eye symptoms, eye pain, photophobia, vision change and visual disturbance.  Ears/Nose/Throat/Neck: Denied ear, nose, throat or neck symptoms, hearing loss, nasal discharge, sinus congestion and sore throat.  Cardiovascular: Denied cardiovascular symptoms, arrhythmia, chest pain/pressure, edema, exercise intolerance, orthopnea and palpitations.  Respiratory: Denied pulmonary symptoms, asthma, pleuritic pain, productive sputum, cough, dyspnea and wheezing.  Gastrointestinal: Denied, gastro-esophageal reflux, melena, nausea and vomiting.  Genitourinary: Denied genitourinary symptoms including symptomatic vaginal discharge, pelvic relaxation issues, and urinary complaints.  Musculoskeletal: Denied musculoskeletal symptoms, stiffness, swelling, muscle weakness and myalgia.  Dermatologic: Denied dermatology symptoms, rash and scar.  Neurologic: Denied neurology symptoms, dizziness, headache, neck pain and syncope.  Psychiatric: Denied psychiatric symptoms, anxiety and depression.  Endocrine: Denied endocrine symptoms including hot flashes and night sweats.      Objective:    BP 110/70   Pulse 98   Ht 5' 0.35" (1.533 m)   Wt 189 lb (85.7 kg)   LMP 06/06/2023 (Exact Date)   Breastfeeding Unknown   BMI 36.48 kg/m   Constitutional: Well-developed, well-nourished female in no acute distress Neurological: Alert and oriented to person, place, and time Psychiatric: Mood and affect appropriate Skin: No rashes or lesions Neck: Supple without masses. Trachea is midline.Thyroid is normal size without masses Lymphatics: No cervical,  axillary, supraclavicular, or inguinal adenopathy noted Respiratory: Clear to auscultation bilaterally. Good air movement with normal work of breathing. Cardiovascular: Regular rate and rhythm. Extremities grossly normal, nontender with no edema; pulses regular Gastrointestinal: Soft, nontender, nondistended. No masses or hernias appreciated. No hepatosplenomegaly. No fluid wave. No rebound or guarding. Breast Exam: normal appearance, no masses or tenderness, Inspection negative, No nipple retraction or dimpling, No nipple discharge or bleeding, No axillary or supraclavicular adenopathy, Normal to palpation without dominant masses Genitourinary:         External Genitalia: Normal female genitalia    Vagina: Normal mucosa, no lesions.    Cervix: No lesions, normal size and consistency; no cervical motion tenderness; non-friable; Pap obtained.    Uterus: Normal size and contour; smooth, mobile, NT, anteverted. Adnexae: Non-palpable and non-tender Perineum/Anus: No lesions Rectal: deferred    Assessment/Plan:    Cheryl Cook is a 42 y.o. female [phone] with normal well-woman gynecologic exam.  -Screenings:  Pap: done with cotesting today Mammogram: ordered, is turning 42 in 2wks, instructed to schedule on or after her birthday.  Labs: A1C, Lipid panel, Vit D,  TSH -Contraception: Depo, administered today; 1wk beyond window, UPT negative.  -Vaccines: states is UTD through health dept -Healthy lifestyle modifications discussed: multivitamin, diet, exercise, sunscreen, tobacco and alcohol use. Emphasized importance of regular physical activity.  -Folate recommendation reviewed.  -Precontemplative smoker -All questions answered to patient's satisfaction.   Hx of genital HSV:  -No outbreaks for 20+yrs and unsure of dx per pt's hx.  -If new outbreak, RTC for exam, should culture to ensure she even has.    Return in about 1 year (around 07/20/2024) for annual .    Julieanne Manson, DO [place]  [facility] at [facility]

## 2023-07-22 LAB — LIPID PANEL
Chol/HDL Ratio: 4.6 {ratio} — ABNORMAL HIGH (ref 0.0–4.4)
Cholesterol, Total: 156 mg/dL (ref 100–199)
HDL: 34 mg/dL — ABNORMAL LOW (ref >39)
LDL Chol Calc (NIH): 100 mg/dL — ABNORMAL HIGH (ref 0–99)
Triglycerides: 120 mg/dL (ref 0–149)
VLDL Cholesterol Cal: 22 mg/dL (ref 5–40)

## 2023-07-22 LAB — TSH: TSH: 1.74 u[IU]/mL (ref 0.450–4.500)

## 2023-07-22 LAB — VITAMIN D 25 HYDROXY (VIT D DEFICIENCY, FRACTURES): Vit D, 25-Hydroxy: 14.2 ng/mL — ABNORMAL LOW (ref 30.0–100.0)

## 2023-07-27 ENCOUNTER — Other Ambulatory Visit: Payer: Self-pay

## 2023-07-27 ENCOUNTER — Telehealth: Payer: Self-pay

## 2023-07-27 DIAGNOSIS — A599 Trichomoniasis, unspecified: Secondary | ICD-10-CM

## 2023-07-27 LAB — CYTOLOGY - PAP
Comment: NEGATIVE
Diagnosis: NEGATIVE
Diagnosis: REACTIVE
High risk HPV: NEGATIVE

## 2023-07-27 MED ORDER — METRONIDAZOLE 500 MG PO TABS
500.0000 mg | ORAL_TABLET | Freq: Two times a day (BID) | ORAL | 0 refills | Status: AC
Start: 2023-07-27 — End: ?

## 2023-07-27 NOTE — Telephone Encounter (Signed)
Called pt to advise of pap results. No answer, sent a Mychart message about positive Trichomoniasis. Medicine sent to her pharmacy to treat. Will call back to see if I can speak to pt.

## 2023-07-28 NOTE — Progress Notes (Signed)
Called pt to advise of positive trichomoniasis, no answer and voice mailbox not set up, mychart message sent

## 2023-07-29 ENCOUNTER — Telehealth: Payer: Self-pay

## 2023-07-29 NOTE — Telephone Encounter (Signed)
Called pt to advise of her positive trichomonas, pt aware medication has been sent to the pharmacy,

## 2023-10-13 ENCOUNTER — Ambulatory Visit: Payer: MEDICAID

## 2023-10-18 NOTE — Progress Notes (Unsigned)
 error

## 2023-10-25 ENCOUNTER — Ambulatory Visit: Payer: MEDICAID

## 2023-12-08 ENCOUNTER — Encounter: Payer: Self-pay | Admitting: Obstetrics

## 2024-04-15 ENCOUNTER — Other Ambulatory Visit (HOSPITAL_COMMUNITY): Payer: Self-pay
